# Patient Record
Sex: Female | Born: 1940 | Race: White | Hispanic: No | Marital: Married | Smoking: Current some day smoker
Health system: Southern US, Community
[De-identification: ages and names within clinical notes are randomized; demographics above are authoritative.]

## PROBLEM LIST (undated history)

## (undated) DIAGNOSIS — E785 Hyperlipidemia, unspecified: Secondary | ICD-10-CM

## (undated) DIAGNOSIS — I639 Cerebral infarction, unspecified: Secondary | ICD-10-CM

## (undated) DIAGNOSIS — I10 Essential (primary) hypertension: Secondary | ICD-10-CM

## (undated) DIAGNOSIS — Z95828 Presence of other vascular implants and grafts: Secondary | ICD-10-CM

## (undated) DIAGNOSIS — Z720 Tobacco use: Secondary | ICD-10-CM

## (undated) DIAGNOSIS — I2510 Atherosclerotic heart disease of native coronary artery without angina pectoris: Secondary | ICD-10-CM

## (undated) HISTORY — DX: Cerebral infarction, unspecified: I63.9

## (undated) HISTORY — DX: Presence of other vascular implants and grafts: Z95.828

## (undated) HISTORY — DX: Hyperlipidemia, unspecified: E78.5

## (undated) HISTORY — DX: Tobacco use: Z72.0

## (undated) HISTORY — DX: Essential (primary) hypertension: I10

## (undated) HISTORY — DX: Atherosclerotic heart disease of native coronary artery without angina pectoris: I25.10

---

## 2001-09-12 ENCOUNTER — Other Ambulatory Visit: Admission: RE | Admit: 2001-09-12 | Discharge: 2001-09-12 | Payer: Self-pay | Admitting: Family Medicine

## 2004-06-09 ENCOUNTER — Emergency Department (HOSPITAL_COMMUNITY): Admission: EM | Admit: 2004-06-09 | Discharge: 2004-06-09 | Payer: Self-pay | Admitting: Emergency Medicine

## 2005-01-21 ENCOUNTER — Ambulatory Visit: Payer: Self-pay | Admitting: Internal Medicine

## 2006-02-07 ENCOUNTER — Ambulatory Visit: Payer: Self-pay | Admitting: Internal Medicine

## 2006-02-10 ENCOUNTER — Ambulatory Visit: Payer: Self-pay | Admitting: General Surgery

## 2006-10-18 ENCOUNTER — Ambulatory Visit: Payer: Self-pay | Admitting: Cardiovascular Disease

## 2006-10-18 HISTORY — PX: ABDOMINAL ANGIOGRAM: SHX5705

## 2007-02-09 ENCOUNTER — Ambulatory Visit: Payer: Self-pay | Admitting: Internal Medicine

## 2007-04-18 ENCOUNTER — Ambulatory Visit (HOSPITAL_COMMUNITY): Admission: RE | Admit: 2007-04-18 | Discharge: 2007-04-18 | Payer: Self-pay | Admitting: Cardiovascular Disease

## 2007-04-18 HISTORY — PX: CARDIAC CATHETERIZATION: SHX172

## 2009-04-30 ENCOUNTER — Encounter: Payer: Self-pay | Admitting: Cardiovascular Disease

## 2009-11-14 ENCOUNTER — Telehealth (INDEPENDENT_AMBULATORY_CARE_PROVIDER_SITE_OTHER): Payer: Self-pay | Admitting: *Deleted

## 2010-01-08 ENCOUNTER — Inpatient Hospital Stay (HOSPITAL_COMMUNITY)
Admission: EM | Admit: 2010-01-08 | Discharge: 2010-01-10 | Payer: Self-pay | Source: Home / Self Care | Attending: Internal Medicine | Admitting: Internal Medicine

## 2010-01-08 LAB — CBC
HCT: 42.6 % (ref 36.0–46.0)
Hemoglobin: 13.8 g/dL (ref 12.0–15.0)
MCH: 30.9 pg (ref 26.0–34.0)
MCHC: 32.4 g/dL (ref 30.0–36.0)
MCV: 95.5 fL (ref 78.0–100.0)
Platelets: 192 10*3/uL (ref 150–400)
RBC: 4.46 MIL/uL (ref 3.87–5.11)
RDW: 13.7 % (ref 11.5–15.5)
WBC: 10.8 10*3/uL — ABNORMAL HIGH (ref 4.0–10.5)

## 2010-01-08 LAB — BASIC METABOLIC PANEL
BUN: 10 mg/dL (ref 6–23)
CO2: 25 mEq/L (ref 19–32)
Calcium: 9.8 mg/dL (ref 8.4–10.5)
Chloride: 106 mEq/L (ref 96–112)
Creatinine, Ser: 0.92 mg/dL (ref 0.4–1.2)
GFR calc Af Amer: >60 mL/min (ref >60)
GFR calc non Af Amer: >60 mL/min (ref >60)
Glucose, Bld: 90 mg/dL (ref 70–99)
Potassium: 4.1 mEq/L (ref 3.5–5.1)
Sodium: 140 mEq/L (ref 135–145)

## 2010-01-08 LAB — SEDIMENTATION RATE: Sed Rate: 10 mm/hr (ref 0–22)

## 2010-01-08 LAB — CK TOTAL AND CKMB (NOT AT ARMC)
CK, MB: 1.2 ng/mL (ref 0.3–4.0)
Relative Index: INVALID (ref 0.0–2.5)
Total CK: 55 U/L (ref 7–177)

## 2010-01-08 LAB — GLUCOSE, CAPILLARY: Glucose-Capillary: 84 mg/dL (ref 70–99)

## 2010-01-08 LAB — DIFFERENTIAL
Basophils Absolute: 0 10*3/uL (ref 0.0–0.1)
Basophils Relative: 0 % (ref 0–1)
Eosinophils Absolute: 0.1 10*3/uL (ref 0.0–0.7)
Eosinophils Relative: 1 % (ref 0–5)
Lymphocytes Relative: 16 % (ref 12–46)
Lymphs Abs: 1.8 10*3/uL (ref 0.7–4.0)
Monocytes Absolute: 0.8 10*3/uL (ref 0.1–1.0)
Monocytes Relative: 7 % (ref 3–12)
Neutro Abs: 8.2 10*3/uL — ABNORMAL HIGH (ref 1.7–7.7)
Neutrophils Relative %: 76 % (ref 43–77)

## 2010-01-08 LAB — TROPONIN I: Troponin I: 0.02 ng/mL (ref 0.00–0.06)

## 2010-01-08 LAB — PROTIME-INR
INR: 0.95 (ref 0.00–1.49)
Prothrombin Time: 12.9 seconds (ref 11.6–15.2)

## 2010-01-08 LAB — APTT: aPTT: 31 seconds (ref 24–37)

## 2010-01-09 ENCOUNTER — Encounter (INDEPENDENT_AMBULATORY_CARE_PROVIDER_SITE_OTHER): Payer: Self-pay | Admitting: Internal Medicine

## 2010-01-09 LAB — CBC
HCT: 41.7 % (ref 36.0–46.0)
Hemoglobin: 13.8 g/dL (ref 12.0–15.0)
MCH: 31.5 pg (ref 26.0–34.0)
MCHC: 33.1 g/dL (ref 30.0–36.0)
MCV: 95.2 fL (ref 78.0–100.0)
Platelets: 183 10*3/uL (ref 150–400)
RBC: 4.38 MIL/uL (ref 3.87–5.11)
RDW: 13.7 % (ref 11.5–15.5)
WBC: 7.7 10*3/uL (ref 4.0–10.5)

## 2010-01-09 LAB — COMPREHENSIVE METABOLIC PANEL
ALT: 13 U/L (ref 0–35)
AST: 17 U/L (ref 0–37)
Albumin: 3.4 g/dL — ABNORMAL LOW (ref 3.5–5.2)
Alkaline Phosphatase: 73 U/L (ref 39–117)
BUN: 11 mg/dL (ref 6–23)
CO2: 23 mEq/L (ref 19–32)
Calcium: 9.5 mg/dL (ref 8.4–10.5)
Chloride: 107 mEq/L (ref 96–112)
Creatinine, Ser: 0.95 mg/dL (ref 0.4–1.2)
GFR calc Af Amer: >60 mL/min (ref >60)
GFR calc non Af Amer: 58 mL/min — ABNORMAL LOW (ref >60)
Glucose, Bld: 91 mg/dL (ref 70–99)
Potassium: 3.9 mEq/L (ref 3.5–5.1)
Sodium: 142 mEq/L (ref 135–145)
Total Bilirubin: 0.4 mg/dL (ref 0.3–1.2)
Total Protein: 6.3 g/dL (ref 6.0–8.3)

## 2010-01-09 LAB — URINALYSIS, ROUTINE W REFLEX MICROSCOPIC
Bilirubin Urine: NEGATIVE
Ketones, ur: NEGATIVE mg/dL
Nitrite: NEGATIVE
Protein, ur: NEGATIVE mg/dL
Specific Gravity, Urine: 1.017 (ref 1.005–1.030)
Urine Glucose, Fasting: NEGATIVE mg/dL
Urobilinogen, UA: 0.2 mg/dL (ref 0.0–1.0)
pH: 6 (ref 5.0–8.0)

## 2010-01-09 LAB — LIPID PANEL
Cholesterol: 113 mg/dL (ref 0–200)
HDL: 51 mg/dL (ref >39)
LDL Cholesterol: 43 mg/dL (ref 0–99)
Total CHOL/HDL Ratio: 2.2 RATIO
Triglycerides: 94 mg/dL (ref <150)
VLDL: 19 mg/dL (ref 0–40)

## 2010-01-09 LAB — ANA: Anti Nuclear Antibody(ANA): NEGATIVE

## 2010-01-09 LAB — URINE MICROSCOPIC-ADD ON

## 2010-01-09 LAB — HEMOGLOBIN A1C
Hgb A1c MFr Bld: 6 % — ABNORMAL HIGH (ref <5.7)
Mean Plasma Glucose: 126 mg/dL — ABNORMAL HIGH (ref <117)

## 2010-01-09 LAB — MAGNESIUM: Magnesium: 2.1 mg/dL (ref 1.5–2.5)

## 2010-02-03 NOTE — Miscellaneous (Signed)
Summary: metoprolol refill  Clinical Lists Changes  Medications: Added new medication of METOPROLOL SUCCINATE 25 MG XR24H-TAB (METOPROLOL SUCCINATE) Take one tablet by mouth daily - Signed Rx of METOPROLOL SUCCINATE 25 MG XR24H-TAB (METOPROLOL SUCCINATE) Take one tablet by mouth daily;  #30 x 3;  Signed;  Entered by: Charlena Cross, RN, BSN;  Authorized by: Dossie Arbour MD;  Method used: Electronically to [facility] [address] [place], [place]  [address], Ph: [phone], Fax: [phone]    Prescriptions: METOPROLOL SUCCINATE 25 MG XR24H-TAB (METOPROLOL SUCCINATE) Take one tablet by mouth daily  #30 x 3   Entered by:   Charlena Cross, RN, BSN   Authorized by:   Dossie Arbour MD   Signed by:   Charlena Cross, RN, BSN on 04/30/2009   Method used:   Electronically to        [facility]* (retail)       [address]       [place], [place]  [address]       Ph: [phone]       Fax: [phone]   RxID:   [id number]

## 2010-02-03 NOTE — Progress Notes (Signed)
       New/Updated Medications: CRESTOR 40 MG TABS (ROSUVASTATIN CALCIUM) one tablet at bedtime LISINOPRIL 20 MG TABS (LISINOPRIL) one tablet once daily ISOSORBIDE MONONITRATE CR 60 MG XR24H-TAB (ISOSORBIDE MONONITRATE) one tablet once daily

## 2010-02-24 ENCOUNTER — Ambulatory Visit: Payer: Self-pay | Admitting: Internal Medicine

## 2010-05-19 NOTE — Discharge Summary (Signed)
NAMEWEDA, BAUMGARNER NO.:  [id number]   MEDICAL RECORD NO.:  [id number]          PATIENT TYPE:  OIB   LOCATION:  2899                         FACILITY:  [facility]   PHYSICIAN:  Antonieta Iba, MD   DATE OF BIRTH:  1940-04-10   DATE OF ADMISSION:  04/18/2007  DATE OF DISCHARGE:  04/18/2007                               DISCHARGE SUMMARY   PHYSICIAN PERFORMING PROCEDURE:  Antonieta Iba, MD.   REASON FOR PROCEDURE:  Mrs. Wachtel is a 70 year old woman with a history  of peripheral vascular disease, history of stenting to her iliac artery,  mild-to-moderate carotid disease, hyperlipidemia, smoker with a negative  stress test in 2008, who presents with worsening chest pain and left arm  pain with exertion.  She was brought for the cardiac catheterization,  given her profound history of peripheral vascular disease and symptoms  suggestive of unstable angina.   PROCEDURES:  Ms. Patient was explained the risks and benefits of the  cardiac catheterization.  She has been aware of the stroke risks and  heart attack risks and agreed to have the procedure performed.  She was  prepped and draped in the usual sterile fashion and the right groin was  numbed using local anesthesia.  A 5-French catheter was used to  cannulate the right femoral artery using the modified Seldinger  technique.  A 5-French JL-4 and JR-4 were used to cannulate the left  main and ostial RCA and injections were performed of the coronary  anatomy and recorded at different angles to analyze the coronary artery.  A pigtail catheter was advanced into the LV and LV gram was performed  using a power injection of 30 mL, over 12 mL per second.  Pullback was  also recorded to evaluate any further aortic stenosis.   RESULTS:  1. Left main:  The left main is a short moderate sized vessel with no      significant disease.  They bifurcate into the LAD and left      circumflex.  2. Left anterior descending:  The  LAD is a moderate-length vessel with      one-diagonal vessel.  There is a 30% proximal LAD disease, that is      eccentric, long.  The mid distal LAD has no significant disease and      diagonal vessel appears to have no significant stenosis.  3. Left circumflex.  The left circumflex has several obtuse marginal      branches and is a dominant vessel.  There is 60% to 70% disease of      the mid circumflex after the take off of the OM1.  Otherwise, there      is only mild luminal irregularities noted.  4. Right coronary artery:  The RCA is a nondominant small vessel with      mild luminal irregularities noted in the proximal region of 10 to      20%.  5. LV gram was performed showing normal LV systolic function with      estimated ejection fraction greater than 55%.  There  is no evidence      of aortic stenosis or mitral regurgitation.   FINAL IMPRESSION:  Left dominant coronary system with moderately severe  mid left circumflex disease at the take-off of the obtuse marginal  branch.  There is also  luminal irregularities of the proximal LAD, that  are approximately 30% and mild irregularities of the RCA.  These  findings were discussed  with Mrs. Mal Amabile, and as she  is symptom free on Imdur 30 mg twice a day and Bystolic as well as her  other outpatient medications,  she preferred no intervention at this  time.  She was going to stop smoking, and I will work with her to get  her lipids as low as possible.  Should she continue to have symptoms, we  will bring her back for PTCA of her lesion in the circumflex.      Antonieta Iba, MD  Electronically Signed     TJG/MEDQ  D:  04/18/2007  T:  04/19/2007  Job:  [id number]

## 2011-05-19 ENCOUNTER — Ambulatory Visit: Payer: Self-pay | Admitting: Ophthalmology

## 2011-05-31 ENCOUNTER — Ambulatory Visit: Payer: Self-pay | Admitting: Ophthalmology

## 2012-05-30 ENCOUNTER — Telehealth: Payer: Self-pay | Admitting: Cardiovascular Disease

## 2012-05-30 NOTE — Telephone Encounter (Signed)
Returned call.  Female answering stated pt is at dentist's office.  Stated he will go back there and have pt call.  Pt also needs an appt to be seen as it has been over 1 year since her last visit.

## 2012-05-30 NOTE — Telephone Encounter (Signed)
Message forwarded to B. Hager, PA-C for further instructions.  

## 2012-05-30 NOTE — Telephone Encounter (Signed)
Pt called back and informed.  Stated the dentist, Dr. Morrison Old, needs a fax with the information.  Fax# [phone].  Pt informed form will be faxed and that she will need an appt as it has been >34yr since last visit (scheduling will contact pt).  Pt verbalized understanding and agreed w/ plan.  Form faxed.

## 2012-05-30 NOTE — Telephone Encounter (Signed)
Restart ASA and Plavix after dental appt.

## 2012-05-30 NOTE — Telephone Encounter (Signed)
Pt is at the dentist right now-Too herself off of Plavix about 2 wks ago and stopped aspirin also.The dentist need you to fax that this is all right.Please fax to Dr Morrison Old [phone] Please do this asap!

## 2012-06-05 ENCOUNTER — Ambulatory Visit: Payer: Self-pay | Admitting: Cardiovascular Disease

## 2012-06-09 ENCOUNTER — Ambulatory Visit: Payer: Self-pay | Admitting: Cardiovascular Disease

## 2012-06-20 ENCOUNTER — Encounter: Payer: Self-pay | Admitting: Cardiovascular Disease

## 2012-06-21 ENCOUNTER — Encounter: Payer: Self-pay | Admitting: Cardiovascular Disease

## 2012-06-21 ENCOUNTER — Ambulatory Visit (INDEPENDENT_AMBULATORY_CARE_PROVIDER_SITE_OTHER): Payer: Medicare Other | Admitting: Cardiovascular Disease

## 2012-06-21 VITALS — BP 142/86 | HR 57 | Ht 69.0 in | Wt 154.0 lb

## 2012-06-21 DIAGNOSIS — I2510 Atherosclerotic heart disease of native coronary artery without angina pectoris: Secondary | ICD-10-CM

## 2012-06-21 DIAGNOSIS — I10 Essential (primary) hypertension: Secondary | ICD-10-CM

## 2012-06-21 DIAGNOSIS — I739 Peripheral vascular disease, unspecified: Secondary | ICD-10-CM | POA: Insufficient documentation

## 2012-06-21 DIAGNOSIS — E785 Hyperlipidemia, unspecified: Secondary | ICD-10-CM | POA: Insufficient documentation

## 2012-06-21 NOTE — Patient Instructions (Addendum)
Your physician wants you to follow-up in: 1 year. You will receive a reminder letter in the mail two months in advance. If you don't receive a letter, please call our office to schedule the follow-up appointment.  

## 2012-06-21 NOTE — Assessment & Plan Note (Signed)
Status post PTA and stenting of her left common iliac artery 10/18/06. Followup Dopplers performed 01/15/11 were normal. The patient denies claudication.

## 2012-06-21 NOTE — Assessment & Plan Note (Signed)
Status post cardiac catheterization performed 04/18/07 revealing a left dominant system with a 60-70% AV groove circumflex stenosis after the first marginal branch. LV function was normal. Patient gets occasional exertional chest pain which has not changed in frequency or severity. Her last Myoview stress test performed 12/15/10 was nonischemic.

## 2012-06-21 NOTE — Assessment & Plan Note (Signed)
On statin therapy followed by her primary care physician 

## 2012-06-21 NOTE — Assessment & Plan Note (Signed)
Stable on appropriate medications

## 2012-06-21 NOTE — Progress Notes (Signed)
06/21/2012 Lacey Allison   1940/08/12  [id number]  Primary Physician Lacey Shaggy, MD Primary Cardiologist: Lacey Gess MD Lacey Allison   HPI:  The patient is a 72 year old, mildly overweight, married Caucasian female, mother of 2, grandmother to 2 grandchildren who I last saw in the office approximately 5 weeks ago. She returns today to follow up her abnormal EKG. She has a history of CAD status post cath April 18, 2007, revealing a left dominant system, 60% to 70% mid AV groove circumflex stenosis just after a first marginal branch, a 30% eccentric LAD stenosis, normal LV function. She also has history of PVOD status post left common iliac artery PTA and stenting in the past by Dr. Jacinto Allison. She admits to smoking 1 pack per day, and has treated hypertension, dyslipidemia and family history of heart disease. She has complained of some left arm pain at night and had an episode of jaw pain a year ago but none since. Myoview performed in our office December 15, 2010, was completely normal without ischemia. Her EKG does show new inferolateral T-wave inversion compared to prior EKGs, which is somewhat concerning. Lab work performed October 29, 2010, reveals a total cholesterol 136, LDL 58 and HDL 57. Since I last saw her in the office 02/01/11 she denies changes in exertional chest pain. She denies claudication. She's changed from smoking cigarettes to  e- cigarette.      Current Outpatient Prescriptions  Medication Sig Dispense Refill  . aspirin 325 MG tablet Take 325 mg by mouth daily.      . cetirizine (ZYRTEC) 10 MG tablet Take 10 mg by mouth daily.      . clopidogrel (PLAVIX) 75 MG tablet Take 75 mg by mouth daily.      . isosorbide dinitrate (ISORDIL) 30 MG tablet Take 30 mg by mouth daily.       Marland Kitchen lisinopril (PRINIVIL,ZESTRIL) 20 MG tablet Take 20 mg by mouth daily.      . metoprolol (LOPRESSOR) 50 MG tablet Take 50 mg by mouth 2 (two) times daily.      . nitroGLYCERIN  (NITROSTAT) 0.4 MG SL tablet Place 0.4 mg under the tongue every 5 (five) minutes as needed for chest pain.      . rosuvastatin (CRESTOR) 20 MG tablet Take 20 mg by mouth daily.       No current facility-administered medications for this visit.    Allergies  Allergen Reactions  . Bee Venom     History   Social History  . Marital Status: Married    Spouse Name: N/A    Number of Children: N/A  . Years of Education: N/A   Occupational History  . Not on file.   Social History Main Topics  . Smoking status: Current Some Day Smoker -- 50 years  . Smokeless tobacco: Never Used  . Alcohol Use: Yes     Comment: 1-2 drinks every night  . Drug Use: No  . Sexually Active: Not on file   Other Topics Concern  . Not on file   Social History Narrative  . No narrative on file     Review of Systems: General: negative for chills, fever, night sweats or weight changes.  Cardiovascular: negative for chest pain, dyspnea on exertion, edema, orthopnea, palpitations, paroxysmal nocturnal dyspnea or shortness of breath Dermatological: negative for rash Respiratory: negative for cough or wheezing Urologic: negative for hematuria Abdominal: negative for nausea, vomiting, diarrhea, bright red blood per rectum, melena, or hematemesis  Neurologic: negative for visual changes, syncope, or dizziness All other systems reviewed and are otherwise negative except as noted above.    Blood pressure 142/86, pulse 57, height 5\' 9"  (1.753 m), weight 154 lb (69.854 kg).  General appearance: alert and no distress Neck: no adenopathy, no carotid bruit, no JVD, supple, symmetrical, trachea midline and thyroid not enlarged, symmetric, no tenderness/mass/nodules Lungs: clear to auscultation bilaterally Heart: regular rate and rhythm, S1, S2 normal, no murmur, click, rub or gallop Extremities: extremities normal, atraumatic, no cyanosis or edema  EKG sinus bradycardia at 57 with inferolateral T-wave inversion  similar to prior EKGs  ASSESSMENT AND PLAN:   Dyslipidemia On statin therapy followed by her primary care physician  Essential hypertension Stable on appropriate medications  Peripheral arterial disease Status post PTA and stenting of her left common iliac artery 10/18/06. Followup Dopplers performed 01/15/11 were normal. The patient denies claudication.  Coronary artery disease Status post cardiac catheterization performed 04/18/07 revealing a left dominant system with a 60-70% AV groove circumflex stenosis after the first marginal branch. LV function was normal. Patient gets occasional exertional chest pain which has not changed in frequency or severity. Her last Myoview stress test performed 12/15/10 was nonischemic.      Lacey Gess MD FACP,FACC,FAHA, [facility] 06/21/2012 11:08 AM

## 2012-07-24 ENCOUNTER — Other Ambulatory Visit: Payer: Self-pay | Admitting: *Deleted

## 2012-07-24 MED ORDER — ROSUVASTATIN CALCIUM 20 MG PO TABS: 20.0000 mg | ORAL_TABLET | Freq: Every day | ORAL | Status: DC

## 2012-08-21 ENCOUNTER — Other Ambulatory Visit: Payer: Self-pay | Admitting: *Deleted

## 2012-08-21 MED ORDER — LISINOPRIL 20 MG PO TABS: 20.0000 mg | ORAL_TABLET | Freq: Every day | ORAL | Status: DC

## 2012-08-21 NOTE — Telephone Encounter (Signed)
Rx was sent to pharmacy electronically. 

## 2013-04-18 ENCOUNTER — Other Ambulatory Visit: Payer: Self-pay | Admitting: *Deleted

## 2013-04-18 MED ORDER — ISOSORBIDE DINITRATE 30 MG PO TABS: 30.0000 mg | ORAL_TABLET | Freq: Every day | ORAL | Status: DC

## 2013-04-20 ENCOUNTER — Ambulatory Visit: Payer: Self-pay | Admitting: Internal Medicine

## 2013-05-04 ENCOUNTER — Other Ambulatory Visit: Payer: Self-pay

## 2013-05-04 NOTE — Telephone Encounter (Signed)
Received refill request for Isosorbide Mononitrate. Per last office note with Dr Erlene QuanJ Berry, patient reported Isosorbide Dinitrate. Patient has recently picked up that Dinitrate and per Robbie LisBrittainy Simmons, PA-C, she should stay on Dinitrate.

## 2013-06-29 ENCOUNTER — Other Ambulatory Visit: Payer: Self-pay | Admitting: *Deleted

## 2013-06-29 MED ORDER — ISOSORBIDE DINITRATE 30 MG PO TABS: 30.0000 mg | ORAL_TABLET | Freq: Every day | ORAL | Status: DC

## 2013-07-03 ENCOUNTER — Ambulatory Visit: Payer: Self-pay | Admitting: Ophthalmology

## 2013-07-09 ENCOUNTER — Ambulatory Visit: Payer: Self-pay | Admitting: Ophthalmology

## 2013-07-11 ENCOUNTER — Telehealth: Payer: Self-pay | Admitting: Cardiovascular Disease

## 2013-07-11 DIAGNOSIS — E785 Hyperlipidemia, unspecified: Secondary | ICD-10-CM

## 2013-07-11 MED ORDER — ATORVASTATIN CALCIUM 40 MG PO TABS: 40.0000 mg | ORAL_TABLET | Freq: Every day | ORAL | Status: AC

## 2013-07-11 NOTE — Telephone Encounter (Signed)
Patient cannot afford Crestor 20 mg.  Pharmacy suggesting Lipitor 40 mg .Co pay is significantly less.  Please call

## 2013-07-11 NOTE — Telephone Encounter (Signed)
Spoke with amy, crestor changed to lipitor. Pt will have labs checked in 3 months.

## 2013-07-12 ENCOUNTER — Other Ambulatory Visit: Payer: Self-pay | Admitting: *Deleted

## 2013-07-27 ENCOUNTER — Other Ambulatory Visit: Payer: Self-pay | Admitting: *Deleted

## 2013-07-27 MED ORDER — ISOSORBIDE DINITRATE 30 MG PO TABS: 30.0000 mg | ORAL_TABLET | Freq: Every day | ORAL | Status: DC

## 2013-07-27 NOTE — Telephone Encounter (Signed)
Refilled isosorbide DN 30 mg to [facility]. Notation made appointment needed before any additional refills.

## 2013-08-21 ENCOUNTER — Other Ambulatory Visit: Payer: Self-pay | Admitting: *Deleted

## 2013-08-21 MED ORDER — LISINOPRIL 20 MG PO TABS: 20.0000 mg | ORAL_TABLET | Freq: Every day | ORAL | Status: DC

## 2013-08-21 MED ORDER — ISOSORBIDE DINITRATE 30 MG PO TABS: 30.0000 mg | ORAL_TABLET | Freq: Every day | ORAL | Status: DC

## 2013-09-19 ENCOUNTER — Other Ambulatory Visit: Payer: Self-pay | Admitting: *Deleted

## 2013-09-19 NOTE — Telephone Encounter (Signed)
Patient has an appointment on 09/21/13. Isosorbide and Lisinopril will be filled at patient appointment.

## 2013-09-20 ENCOUNTER — Other Ambulatory Visit: Payer: Self-pay | Admitting: *Deleted

## 2013-09-20 ENCOUNTER — Ambulatory Visit (INDEPENDENT_AMBULATORY_CARE_PROVIDER_SITE_OTHER): Payer: Medicare Other | Admitting: Cardiovascular Disease

## 2013-09-20 ENCOUNTER — Encounter: Payer: Self-pay | Admitting: Cardiovascular Disease

## 2013-09-20 VITALS — BP 116/72 | HR 89 | Ht 70.0 in | Wt 148.0 lb

## 2013-09-20 DIAGNOSIS — I209 Angina pectoris, unspecified: Secondary | ICD-10-CM

## 2013-09-20 DIAGNOSIS — I10 Essential (primary) hypertension: Secondary | ICD-10-CM

## 2013-09-20 DIAGNOSIS — I2510 Atherosclerotic heart disease of native coronary artery without angina pectoris: Secondary | ICD-10-CM

## 2013-09-20 DIAGNOSIS — R0989 Other specified symptoms and signs involving the circulatory and respiratory systems: Secondary | ICD-10-CM

## 2013-09-20 DIAGNOSIS — I739 Peripheral vascular disease, unspecified: Secondary | ICD-10-CM

## 2013-09-20 DIAGNOSIS — I25119 Atherosclerotic heart disease of native coronary artery with unspecified angina pectoris: Secondary | ICD-10-CM

## 2013-09-20 MED ORDER — ISOSORBIDE DINITRATE 30 MG PO TABS: 30.0000 mg | ORAL_TABLET | Freq: Every day | ORAL | Status: AC

## 2013-09-20 MED ORDER — LISINOPRIL 20 MG PO TABS: 20.0000 mg | ORAL_TABLET | Freq: Every day | ORAL | Status: AC

## 2013-09-20 NOTE — Patient Instructions (Signed)
Your physician recommends that you schedule a follow-up appointment in: 1 Year   Your physician has requested that you have a carotid duplex. This test is an ultrasound of the carotid arteries in your neck. It looks at blood flow through these arteries that supply the brain with blood. Allow one hour for this exam. There are no restrictions or special instructions.  Your physician has requested that you have a lower extremity arterial duplex. This test is an ultrasound of the arteries in the legs. It looks at arterial blood flow in the legs. Allow one hour for Lower Arterial scans. There are no restrictions or special instructions

## 2013-09-20 NOTE — Assessment & Plan Note (Signed)
History of left common iliac artery PTA and stent 10/18/06. She denies claudication. Her left Dopplers performed 01/15/11 revealed normal ABIs bilaterally.

## 2013-09-20 NOTE — Assessment & Plan Note (Signed)
On statin therapy followed by her PCP 

## 2013-09-20 NOTE — Progress Notes (Signed)
09/20/2013 Lacey Allison   Dec 26, 1940  [id number]  Primary Physician Lacey Shaggy, MD Primary Cardiologist: Lacey Gess MD Lacey Allison   HPI:  The patient is a 73year-old, mildly overweight, married Caucasian female, mother of 2, grandmother to 2 grandchildren who I last saw in the office approximately 1 year ago. She has a history of CAD status post cath April 18, 2007, revealing a left dominant system, 60% to 70% mid AV groove circumflex stenosis just after a first marginal branch, a 30% eccentric LAD stenosis, normal LV function.her last functional study performed 12/15/10 was nonischemic. She also has history of PVOD status post left common iliac artery PTA and stenting in the past by Dr. Jacinto Halim. She admits to smoking 1 pack per day, and has treated hypertension, dyslipidemia and family history of heart disease..  She denies chest pain shortness of breath or claudication.   Current Outpatient Prescriptions  Medication Sig Dispense Refill  . aspirin 325 MG tablet Take 325 mg by mouth daily.      Marland Kitchen atorvastatin (LIPITOR) 40 MG tablet Take 1 tablet (40 mg total) by mouth daily.  90 tablet  3  . cetirizine (ZYRTEC) 10 MG tablet Take 10 mg by mouth daily.      . clopidogrel (PLAVIX) 75 MG tablet Take 75 mg by mouth daily.      . isosorbide dinitrate (ISORDIL) 30 MG tablet Take 1 tablet (30 mg total) by mouth daily.  30 tablet  0  . lisinopril (PRINIVIL,ZESTRIL) 20 MG tablet Take 1 tablet (20 mg total) by mouth daily.  30 tablet  0  . metoprolol (LOPRESSOR) 50 MG tablet Take 50 mg by mouth daily.       . nitroGLYCERIN (NITROSTAT) 0.4 MG SL tablet Place 0.4 mg under the tongue every 5 (five) minutes as needed for chest pain.       No current facility-administered medications for this visit.    Allergies  Allergen Reactions  . Bee Venom     History   Social History  . Marital Status: Married    Spouse Name: N/A    Number of Children: N/A  . Years of Education: N/A    Occupational History  . Not on file.   Social History Main Topics  . Smoking status: Current Some Day Smoker -- 50 years  . Smokeless tobacco: Never Used  . Alcohol Use: Yes     Comment: 1-2 drinks every night  . Drug Use: No  . Sexual Activity: Not on file   Other Topics Concern  . Not on file   Social History Narrative  . No narrative on file     Review of Systems: General: negative for chills, fever, night sweats or weight changes.  Cardiovascular: negative for chest pain, dyspnea on exertion, edema, orthopnea, palpitations, paroxysmal nocturnal dyspnea or shortness of breath Dermatological: negative for rash Respiratory: negative for cough or wheezing Urologic: negative for hematuria Abdominal: negative for nausea, vomiting, diarrhea, bright red blood per rectum, melena, or hematemesis Neurologic: negative for visual changes, syncope, or dizziness All other systems reviewed and are otherwise negative except as noted above.    Blood pressure 116/72, pulse 89, height  (1.778 m), weight 148 lb (67.132 kg).  General appearance: alert and no distress Neck: no adenopathy, no JVD, supple, symmetrical, trachea midline, thyroid not enlarged, symmetric, no tenderness/mass/nodules and soft bilateral carotid bruits Lungs: clear to auscultation bilaterally Heart: regular rate and rhythm, S1, S2 normal, no murmur,  click, rub or gallop Extremities: extremities normal, atraumatic, no cyanosis or edema and 2+ femoral arteries without bruits, 2+ pedal pulses  EKG normal sinus rhythm at 89 with nonspecific ST and T wave changes  ASSESSMENT AND PLAN:   Dyslipidemia On statin therapy followed by her PCP  Coronary artery disease History of CAD status post cardiac catheterization 04/18/07 revealing a left dominant system, 60-70% mid AV groove circumflex stenosis just after the first marginal branch, 30% eccentric LAD stenosis with normal LV function. Her last functional study  performed 12/15/07 was nonischemic. She denies chest pain or shortness of breath.  Peripheral arterial disease History of left common iliac artery PTA and stent 10/18/06. She denies claudication. Her left Dopplers performed 01/15/11 revealed normal ABIs bilaterally.  Essential hypertension Controlled on current medications      Lacey Gess MD [facility] 09/20/2013 4:16 PM

## 2013-09-20 NOTE — Assessment & Plan Note (Signed)
Controlled on current medications 

## 2013-09-20 NOTE — Assessment & Plan Note (Signed)
History of CAD status post cardiac catheterization 04/18/07 revealing a left dominant system, 60-70% mid AV groove circumflex stenosis just after the first marginal branch, 30% eccentric LAD stenosis with normal LV function. Her last functional study performed 12/15/07 was nonischemic. She denies chest pain or shortness of breath.

## 2013-10-02 ENCOUNTER — Ambulatory Visit (HOSPITAL_COMMUNITY)
Admission: RE | Admit: 2013-10-02 | Discharge: 2013-10-02 | Disposition: A | Discharge status: Home / Self Care | Payer: Medicare Other | Source: Ambulatory Visit | Attending: Cardiovascular Disease | Admitting: Cardiovascular Disease

## 2013-10-02 DIAGNOSIS — I739 Peripheral vascular disease, unspecified: Secondary | ICD-10-CM | POA: Diagnosis not present

## 2013-10-02 DIAGNOSIS — R0989 Other specified symptoms and signs involving the circulatory and respiratory systems: Secondary | ICD-10-CM

## 2013-10-02 DIAGNOSIS — I714 Abdominal aortic aneurysm, without rupture, unspecified: Secondary | ICD-10-CM | POA: Diagnosis not present

## 2013-10-02 NOTE — Progress Notes (Signed)
Carotid Duplex Completed. °Brianna L Mazza,RVT °

## 2013-10-02 NOTE — Progress Notes (Signed)
Lower Extremity Arterial Duplex Completed. °Brianna L Mazza,RVT °

## 2013-10-09 ENCOUNTER — Telehealth: Payer: Self-pay | Admitting: Cardiovascular Disease

## 2013-10-09 DIAGNOSIS — I739 Peripheral vascular disease, unspecified: Secondary | ICD-10-CM

## 2013-10-09 NOTE — Telephone Encounter (Signed)
Pt was told to call and speak to Lacey Allison about receiving her results from her doppler.Please call back  Thanks

## 2013-10-09 NOTE — Telephone Encounter (Signed)
Spoke to patient. Result given . Verbalized understanding PATIENT WANTED TO KNOW THE MEASUREMENT  3.2 x3.5 cm She aware will repeat in 6 month- ORDER PLACED

## 2014-03-28 ENCOUNTER — Encounter: Payer: Self-pay | Admitting: Cardiovascular Disease

## 2014-04-01 ENCOUNTER — Encounter: Payer: Self-pay | Admitting: *Deleted

## 2014-04-27 NOTE — Op Note (Signed)
PATIENT NAME:  Lacey Allison, Lacey Allison MR#:  [id number] DATE OF BIRTH:  04-18-1940  DATE OF PROCEDURE:  07/09/2013  PREOPERATIVE DIAGNOSIS: Cataract, left eye.   POSTOPERATIVE DIAGNOSIS: Cataract, left eye.  PROCEDURE PERFORMED: Extracapsular cataract extraction using phacoemulsification with placement of Alcon [id number], 21.0 diopter posterior chamber lens, serial number [id number].   SURGEON: Maylon PeppersSteven A. Moody Robben, MD   ANESTHESIA: 4% lidocaine and 0.75% Marcaine in a 50-50 mixture with 10 units/mL of Hylenex added, given as a peribulbar.   ANESTHESIOLOGIST: Dr. Maisie Fushomas.   COMPLICATIONS: None.   ESTIMATED BLOOD LOSS: Less than 1 mL.   DESCRIPTION OF PROCEDURE:  The patient was brought to the operating room and given a peribulbar block.  The patient was then prepped and draped in the usual fashion.  The vertical rectus muscles were imbricated using 5-0 silk sutures.  These sutures were then clamped to the sterile drapes as bridle sutures.  A limbal peritomy was performed extending two clock hours and hemostasis was obtained with cautery.  A partial thickness scleral groove was made at the surgical limbus and dissected anteriorly in a lamellar dissection using an Alcon crescent knife.  The anterior chamber was entered supero-temporally with a Superblade and through the lamellar dissection with a 2.6 mm keratome.  DisCoVisc was used to replace the aqueous and a continuous tear capsulorrhexis was carried out.  Hydrodissection and hydrodelineation were carried out with balanced salt and a 27 gauge canula.  The nucleus was rotated to confirm the effectiveness of the hydrodissection.  Phacoemulsification was carried out using a divide-and-conquer technique.  Total ultrasound time was 59 seconds with an average power of 24.8%, a CDE of 25.01. No suture was placed.  Irrigation/aspiration was used to remove the residual cortex.  DisCoVisc was used to inflate the capsule and the internal incision was enlarged to  3 mm with the crescent knife.  The intraocular lens was folded and inserted into the capsular bag using the AcrySert delivery system.  Irrigation/aspiration was used to remove the residual DisCoVisc.  Miostat was injected into the anterior chamber through the paracentesis track to inflate the anterior chamber and induce miosis.  Two drops of 0.01 mL of diluted Vigamox containing 0.1 mg of drug was injected into the anterior chamber via the paracentesis tract. The wound was checked for leaks and none were found. The conjunctiva was closed with cautery and the bridle sutures were removed.   An eye shield was placed on the eye.  The patient was discharged to the recovery room in good condition.     ____________________________ Maylon PeppersSteven A. Winston Misner, MD sad:lm D: 07/09/2013 13:51:04 ET T: 07/10/2013 01:37:58 ET JOB#: [id number]  cc: Viviann SpareSteven A. Chinedu Agustin, MD, <Dictator> Erline LevineSTEVEN A Sahej Hauswirth MD ELECTRONICALLY SIGNED 07/16/2013 12:52

## 2014-04-28 NOTE — Op Note (Signed)
PATIENT NAME:  Myrlene BrokerBROCK, Ellorie MR#:  [id number] DATE OF BIRTH:  1940/11/27  DATE OF PROCEDURE:  05/31/2011  PREOPERATIVE DIAGNOSIS:  Cataract, right eye.   POSTOPERATIVE DIAGNOSIS:  Cataract, right eye.  PROCEDURE PERFORMED:  Extracapsular cataract extraction using phacoemulsification with placement of an Alcon [id number], 21.5-diopter posterior chamber lens, serial # [id number].  SURGEON:  Maylon PeppersSteven A. Rose Hegner, MD  ASSISTANT:  None.  ANESTHESIA:  4% lidocaine and 0.75% Marcaine in a 50/50 mixture with 10 units per mL of Hylenex added, given as a peribulbar.  ANESTHESIOLOGIST:  Dr. Pernell DupreAdams   COMPLICATIONS:  None.  ESTIMATED BLOOD LOSS:  Less than 1 mL.  DESCRIPTION OF PROCEDURE:  The patient was brought to the operating room and given a peribulbar block.  The patient was then prepped and draped in the usual fashion.  The vertical rectus muscles were imbricated using 5-0 silk sutures.  These sutures were then clamped to the sterile drapes as bridle sutures.  A limbal peritomy was performed extending two clock hours and hemostasis was obtained with cautery.  A partial thickness scleral groove was made at the surgical limbus and dissected anteriorly in a lamellar dissection using an Alcon crescent knife.  The anterior chamber was entered superonasally with a Superblade and through the lamellar dissection with a 2.6 mm keratome.  DisCoVisc was used to replace the aqueous and a continuous tear capsulorrhexis was carried out.  Hydrodissection and hydrodelineation were carried out with balanced salt and a 27 gauge canula.  The nucleus was rotated to confirm the effectiveness of the hydrodissection.  Phacoemulsification was carried out using a divide-and-conquer technique.  Total ultrasound time was 1 minute and 23 seconds with an average power of 20.5 percent, CDE 29.46.  Irrigation/aspiration was used to remove the residual cortex.  DisCoVisc was used to inflate the capsule and the internal incision was  enlarged to 3 mm with the crescent knife.  The intraocular lens was folded and inserted into the capsular bag using the AcrySert delivery system.  Irrigation/aspiration was used to remove the residual DisCoVisc.  Miostat was injected into the anterior chamber through the paracentesis track to inflate the anterior chamber and induce miosis.  The wound was checked for leaks and none were found. The conjunctiva was closed with cautery and the bridle sutures were removed.  Two drops of 0.3% Vigamox were placed on the eye.   An eye shield was placed on the eye.  The patient was discharged to the recovery room in good condition.  ____________________________ Maylon PeppersSteven A. Isadore Palecek, MD sad:drc D: 05/31/2011 13:08:33 ET T: 05/31/2011 14:05:51 ET JOB#: [id number]  cc: Viviann SpareSteven A. Maanav Kassabian, MD, <Dictator> Erline LevineSTEVEN A Judith Campillo MD ELECTRONICALLY SIGNED 06/07/2011 12:53

## 2014-07-02 ENCOUNTER — Telehealth: Payer: Self-pay | Admitting: Gastroenterology

## 2014-07-02 NOTE — Telephone Encounter (Signed)
Pt on blood thinners, She has a consult appt with kandice jones then colon will be scheduled

## 2014-07-02 NOTE — Telephone Encounter (Signed)
Referral for colonoscopy triage

## 2014-07-02 NOTE — Telephone Encounter (Signed)
Phoned pt for colon triage, Pt needs office appt on blood thinners

## 2014-07-05 ENCOUNTER — Ambulatory Visit: Payer: Self-pay | Admitting: Urgent Care

## 2014-08-07 ENCOUNTER — Inpatient Hospital Stay (HOSPITAL_COMMUNITY): Payer: Medicare Other

## 2014-08-07 ENCOUNTER — Emergency Department (HOSPITAL_COMMUNITY): Payer: Medicare Other

## 2014-08-07 ENCOUNTER — Inpatient Hospital Stay (HOSPITAL_COMMUNITY)
Admission: EM | Admit: 2014-08-07 | Discharge: 2014-09-05 | DRG: 064 | Disposition: E | Payer: Medicare Other | Attending: Neurology | Admitting: Neurology

## 2014-08-07 ENCOUNTER — Encounter (HOSPITAL_COMMUNITY): Payer: Self-pay | Admitting: Radiology

## 2014-08-07 DIAGNOSIS — R000 Tachycardia, unspecified: Secondary | ICD-10-CM | POA: Diagnosis present

## 2014-08-07 DIAGNOSIS — Z79899 Other long term (current) drug therapy: Secondary | ICD-10-CM | POA: Diagnosis not present

## 2014-08-07 DIAGNOSIS — G935 Compression of brain: Secondary | ICD-10-CM | POA: Diagnosis present

## 2014-08-07 DIAGNOSIS — I10 Essential (primary) hypertension: Secondary | ICD-10-CM | POA: Diagnosis not present

## 2014-08-07 DIAGNOSIS — Z7902 Long term (current) use of antithrombotics/antiplatelets: Secondary | ICD-10-CM | POA: Diagnosis not present

## 2014-08-07 DIAGNOSIS — R739 Hyperglycemia, unspecified: Secondary | ICD-10-CM | POA: Diagnosis present

## 2014-08-07 DIAGNOSIS — Z66 Do not resuscitate: Secondary | ICD-10-CM | POA: Diagnosis present

## 2014-08-07 DIAGNOSIS — E785 Hyperlipidemia, unspecified: Secondary | ICD-10-CM | POA: Diagnosis present

## 2014-08-07 DIAGNOSIS — Z91030 Bee allergy status: Secondary | ICD-10-CM | POA: Diagnosis not present

## 2014-08-07 DIAGNOSIS — J9600 Acute respiratory failure, unspecified whether with hypoxia or hypercapnia: Secondary | ICD-10-CM | POA: Diagnosis not present

## 2014-08-07 DIAGNOSIS — I2510 Atherosclerotic heart disease of native coronary artery without angina pectoris: Secondary | ICD-10-CM | POA: Diagnosis present

## 2014-08-07 DIAGNOSIS — I611 Nontraumatic intracerebral hemorrhage in hemisphere, cortical: Principal | ICD-10-CM | POA: Diagnosis present

## 2014-08-07 DIAGNOSIS — I6200 Nontraumatic subdural hemorrhage, unspecified: Secondary | ICD-10-CM | POA: Diagnosis present

## 2014-08-07 DIAGNOSIS — Z515 Encounter for palliative care: Secondary | ICD-10-CM

## 2014-08-07 DIAGNOSIS — Z8673 Personal history of transient ischemic attack (TIA), and cerebral infarction without residual deficits: Secondary | ICD-10-CM

## 2014-08-07 DIAGNOSIS — I629 Nontraumatic intracranial hemorrhage, unspecified: Secondary | ICD-10-CM

## 2014-08-07 DIAGNOSIS — F17210 Nicotine dependence, cigarettes, uncomplicated: Secondary | ICD-10-CM | POA: Diagnosis present

## 2014-08-07 DIAGNOSIS — I619 Nontraumatic intracerebral hemorrhage, unspecified: Secondary | ICD-10-CM | POA: Diagnosis present

## 2014-08-07 DIAGNOSIS — G9340 Encephalopathy, unspecified: Secondary | ICD-10-CM | POA: Diagnosis present

## 2014-08-07 DIAGNOSIS — Z7982 Long term (current) use of aspirin: Secondary | ICD-10-CM

## 2014-08-07 DIAGNOSIS — W1830XA Fall on same level, unspecified, initial encounter: Secondary | ICD-10-CM | POA: Diagnosis present

## 2014-08-07 DIAGNOSIS — I615 Nontraumatic intracerebral hemorrhage, intraventricular: Secondary | ICD-10-CM | POA: Diagnosis present

## 2014-08-07 DIAGNOSIS — E876 Hypokalemia: Secondary | ICD-10-CM

## 2014-08-07 DIAGNOSIS — Y92009 Unspecified place in unspecified non-institutional (private) residence as the place of occurrence of the external cause: Secondary | ICD-10-CM | POA: Diagnosis not present

## 2014-08-07 DIAGNOSIS — Z791 Long term (current) use of non-steroidal anti-inflammatories (NSAID): Secondary | ICD-10-CM | POA: Diagnosis not present

## 2014-08-07 DIAGNOSIS — G936 Cerebral edema: Secondary | ICD-10-CM | POA: Diagnosis present

## 2014-08-07 DIAGNOSIS — Z95828 Presence of other vascular implants and grafts: Secondary | ICD-10-CM

## 2014-08-07 LAB — BLOOD GAS, ARTERIAL
Acid-base deficit: 3.3 mmol/L — ABNORMAL HIGH (ref 0.0–2.0)
Acid-base deficit: 4.9 mmol/L — ABNORMAL HIGH (ref 0.0–2.0)
BICARBONATE: 21 meq/L (ref 20.0–24.0)
BICARBONATE: 19 meq/L — AB (ref 20.0–24.0)
DRAWN BY: 44138
Drawn by: 41875
FIO2: 1
FIO2: 0.4
LHR: 16 {breaths}/min
MECHVT: 560 mL
MECHVT: 560 mL
O2 SAT: 99.2 %
O2 Saturation: 99.5 %
PATIENT TEMPERATURE: 98.6
PEEP/CPAP: 5 cmH2O
PEEP: 5 cmH2O
PH ART: 7.375 (ref 7.350–7.450)
PO2 ART: 161 mmHg — AB (ref 80.0–100.0)
Patient temperature: 98.6
RATE: 16 resp/min
TCO2: 22.1 mmol/L (ref 0–100)
TCO2: 20 mmol/L (ref 0–100)
pCO2 arterial: 36.8 mmHg (ref 35.0–45.0)
pCO2 arterial: 31.3 mmHg — ABNORMAL LOW (ref 35.0–45.0)
pH, Arterial: 7.401 (ref 7.350–7.450)
pO2, Arterial: 439 mmHg — ABNORMAL HIGH (ref 80.0–100.0)

## 2014-08-07 LAB — I-STAT CHEM 8, ED
BUN: 17 mg/dL (ref 6–20)
CHLORIDE: 104 mmol/L (ref 101–111)
CREATININE: 1 mg/dL (ref 0.44–1.00)
Calcium, Ion: 1.27 mmol/L (ref 1.13–1.30)
Glucose, Bld: 206 mg/dL — ABNORMAL HIGH (ref 65–99)
HCT: 48 % — ABNORMAL HIGH (ref 36.0–46.0)
Hemoglobin: 16.3 g/dL — ABNORMAL HIGH (ref 12.0–15.0)
POTASSIUM: 3.2 mmol/L — AB (ref 3.5–5.1)
Sodium: 141 mmol/L (ref 135–145)
TCO2: 21 mmol/L (ref 0–100)

## 2014-08-07 LAB — DIFFERENTIAL
BASOS ABS: 0 10*3/uL (ref 0.0–0.1)
Basophils Relative: 0 % (ref 0–1)
Eosinophils Absolute: 0.2 10*3/uL (ref 0.0–0.7)
Eosinophils Relative: 1 % (ref 0–5)
LYMPHS PCT: 24 % (ref 12–46)
Lymphs Abs: 5.5 10*3/uL — ABNORMAL HIGH (ref 0.7–4.0)
MONOS PCT: 6 % (ref 3–12)
Monocytes Absolute: 1.3 10*3/uL — ABNORMAL HIGH (ref 0.1–1.0)
Neutro Abs: 16.4 10*3/uL — ABNORMAL HIGH (ref 1.7–7.7)
Neutrophils Relative %: 69 % (ref 43–77)

## 2014-08-07 LAB — COMPREHENSIVE METABOLIC PANEL
ALBUMIN: 3.9 g/dL (ref 3.5–5.0)
ALT: 26 U/L (ref 14–54)
AST: 38 U/L (ref 15–41)
Alkaline Phosphatase: 83 U/L (ref 38–126)
Anion gap: 13 (ref 5–15)
BUN: 15 mg/dL (ref 6–20)
CO2: 20 mmol/L — ABNORMAL LOW (ref 22–32)
CREATININE: 1.1 mg/dL — AB (ref 0.44–1.00)
Calcium: 9.9 mg/dL (ref 8.9–10.3)
Chloride: 107 mmol/L (ref 101–111)
GFR calc Af Amer: 56 mL/min — ABNORMAL LOW (ref >60)
GFR, EST NON AFRICAN AMERICAN: 49 mL/min — AB (ref >60)
GLUCOSE: 201 mg/dL — AB (ref 65–99)
Potassium: 3.4 mmol/L — ABNORMAL LOW (ref 3.5–5.1)
SODIUM: 140 mmol/L (ref 135–145)
TOTAL PROTEIN: 6.6 g/dL (ref 6.5–8.1)
Total Bilirubin: 0.4 mg/dL (ref 0.3–1.2)

## 2014-08-07 LAB — BASIC METABOLIC PANEL
ANION GAP: 15 (ref 5–15)
BUN: 14 mg/dL (ref 6–20)
CALCIUM: 9.6 mg/dL (ref 8.9–10.3)
CO2: 21 mmol/L — AB (ref 22–32)
Chloride: 105 mmol/L (ref 101–111)
Creatinine, Ser: 1.19 mg/dL — ABNORMAL HIGH (ref 0.44–1.00)
GFR, EST AFRICAN AMERICAN: 51 mL/min — AB (ref >60)
GFR, EST NON AFRICAN AMERICAN: 44 mL/min — AB (ref >60)
Glucose, Bld: 224 mg/dL — ABNORMAL HIGH (ref 65–99)
Potassium: 2.7 mmol/L — CL (ref 3.5–5.1)
Sodium: 141 mmol/L (ref 135–145)

## 2014-08-07 LAB — GLUCOSE, CAPILLARY
Glucose-Capillary: 214 mg/dL — ABNORMAL HIGH (ref 65–99)
Glucose-Capillary: 164 mg/dL — ABNORMAL HIGH (ref 65–99)

## 2014-08-07 LAB — CBC
HCT: 45.2 % (ref 36.0–46.0)
HCT: 41.2 % (ref 36.0–46.0)
HEMOGLOBIN: 14 g/dL (ref 12.0–15.0)
HEMOGLOBIN: 15.1 g/dL — AB (ref 12.0–15.0)
MCH: 32.3 pg (ref 26.0–34.0)
MCH: 32.1 pg (ref 26.0–34.0)
MCHC: 33.4 g/dL (ref 30.0–36.0)
MCHC: 34 g/dL (ref 30.0–36.0)
MCV: 94.5 fL (ref 78.0–100.0)
MCV: 96.8 fL (ref 78.0–100.0)
PLATELETS: 183 10*3/uL (ref 150–400)
Platelets: 179 10*3/uL (ref 150–400)
RBC: 4.67 MIL/uL (ref 3.87–5.11)
RBC: 4.36 MIL/uL (ref 3.87–5.11)
RDW: 13.1 % (ref 11.5–15.5)
RDW: 13 % (ref 11.5–15.5)
WBC: 23.4 10*3/uL — ABNORMAL HIGH (ref 4.0–10.5)
WBC: 27.2 10*3/uL — ABNORMAL HIGH (ref 4.0–10.5)

## 2014-08-07 LAB — URINE MICROSCOPIC-ADD ON

## 2014-08-07 LAB — RAPID URINE DRUG SCREEN, HOSP PERFORMED
AMPHETAMINES: NOT DETECTED
Barbiturates: NOT DETECTED
Benzodiazepines: NOT DETECTED
Cocaine: NOT DETECTED
Opiates: NOT DETECTED
Tetrahydrocannabinol: NOT DETECTED

## 2014-08-07 LAB — MRSA PCR SCREENING: MRSA BY PCR: NEGATIVE

## 2014-08-07 LAB — I-STAT TROPONIN, ED: TROPONIN I, POC: 0 ng/mL (ref 0.00–0.08)

## 2014-08-07 LAB — CBG MONITORING, ED: Glucose-Capillary: 214 mg/dL — ABNORMAL HIGH (ref 65–99)

## 2014-08-07 LAB — URINALYSIS, ROUTINE W REFLEX MICROSCOPIC
Bilirubin Urine: NEGATIVE
Glucose, UA: 500 mg/dL — AB
KETONES UR: NEGATIVE mg/dL
Leukocytes, UA: NEGATIVE
Nitrite: NEGATIVE
PH: 7 (ref 5.0–8.0)
SPECIFIC GRAVITY, URINE: 1.013 (ref 1.005–1.030)
UROBILINOGEN UA: 0.2 mg/dL (ref 0.0–1.0)

## 2014-08-07 LAB — APTT: aPTT: 23 seconds — ABNORMAL LOW (ref 24–37)

## 2014-08-07 LAB — PROTIME-INR
INR: 0.97 (ref 0.00–1.49)
Prothrombin Time: 13.1 seconds (ref 11.6–15.2)

## 2014-08-07 LAB — ETHANOL: Alcohol, Ethyl (B): <5 mg/dL (ref <5)

## 2014-08-07 MED ORDER — LORAZEPAM 2 MG/ML IJ SOLN: 2.0000 mg | INTRAMUSCULAR | Status: DC | PRN

## 2014-08-07 MED ORDER — SENNOSIDES-DOCUSATE SODIUM 8.6-50 MG PO TABS
1.0000 | ORAL_TABLET | Freq: Two times a day (BID) | ORAL | Status: DC
  Filled 2014-08-07 (×2): qty 1

## 2014-08-07 MED ORDER — SODIUM CHLORIDE 0.9 % IV SOLN
100.0000 ug/h | INTRAVENOUS | Status: DC
  Administered 2014-08-07: 100 ug/h via INTRAVENOUS
  Filled 2014-08-07: qty 50

## 2014-08-07 MED ORDER — INSULIN ASPART 100 UNIT/ML ~~LOC~~ SOLN
2.0000 [IU] | SUBCUTANEOUS | Status: DC
  Administered 2014-08-07: 6 [IU] via SUBCUTANEOUS
  Administered 2014-08-07: 4 [IU] via SUBCUTANEOUS

## 2014-08-07 MED ORDER — SCOPOLAMINE 1 MG/3DAYS TD PT72
1.0000 | MEDICATED_PATCH | TRANSDERMAL | Status: DC
  Administered 2014-08-07: 1.5 mg via TRANSDERMAL
  Filled 2014-08-07: qty 1

## 2014-08-07 MED ORDER — CHLORHEXIDINE GLUCONATE 0.12 % MT SOLN
15.0000 mL | Freq: Two times a day (BID) | OROMUCOSAL | Status: DC
  Filled 2014-08-07: qty 15

## 2014-08-07 MED ORDER — CLEVIDIPINE BUTYRATE 0.5 MG/ML IV EMUL
0.0000 mg/h | INTRAVENOUS | Status: DC
  Administered 2014-08-07: 2 mg/h via INTRAVENOUS
  Filled 2014-08-07: qty 50

## 2014-08-07 MED ORDER — SODIUM CHLORIDE 0.9 % IV SOLN
1.0000 mg/h | INTRAVENOUS | Status: DC
  Administered 2014-08-07: 2 mg/h via INTRAVENOUS
  Filled 2014-08-07: qty 10

## 2014-08-07 MED ORDER — FENTANYL CITRATE (PF) 100 MCG/2ML IJ SOLN
INTRAMUSCULAR | Status: AC
  Administered 2014-08-07: 25 ug
  Filled 2014-08-07: qty 2

## 2014-08-07 MED ORDER — SODIUM CHLORIDE 3 % IV SOLN
INTRAVENOUS | Status: DC
  Administered 2014-08-07: 75 mL/h via INTRAVENOUS
  Filled 2014-08-07 (×3): qty 500

## 2014-08-07 MED ORDER — CHLORHEXIDINE GLUCONATE 0.12 % MT SOLN
15.0000 mL | Freq: Two times a day (BID) | OROMUCOSAL | Status: DC
  Administered 2014-08-07: 15 mL via OROMUCOSAL

## 2014-08-07 MED ORDER — CETYLPYRIDINIUM CHLORIDE 0.05 % MT LIQD
7.0000 mL | Freq: Four times a day (QID) | OROMUCOSAL | Status: DC
  Administered 2014-08-07: 7 mL via OROMUCOSAL

## 2014-08-07 MED ORDER — ACETAMINOPHEN 325 MG PO TABS: 650.0000 mg | ORAL_TABLET | ORAL | Status: DC | PRN

## 2014-08-07 MED ORDER — FAMOTIDINE IN NACL 20-0.9 MG/50ML-% IV SOLN
20.0000 mg | Freq: Two times a day (BID) | INTRAVENOUS | Status: DC
  Administered 2014-08-07: 20 mg via INTRAVENOUS
  Filled 2014-08-07 (×3): qty 50

## 2014-08-07 MED ORDER — SODIUM CHLORIDE 23.4 % INJECTION (4 MEQ/ML) FOR IV ADMINISTRATION
30.0000 mL | Freq: Once | INTRAVENOUS | Status: AC
  Administered 2014-08-07: 30 mL via INTRAVENOUS
  Filled 2014-08-07: qty 30

## 2014-08-07 MED ORDER — ACETAMINOPHEN 650 MG RE SUPP: 650.0000 mg | RECTAL | Status: DC | PRN

## 2014-08-07 MED ORDER — MORPHINE SULFATE 2 MG/ML IJ SOLN: 2.0000 mg | INTRAMUSCULAR | Status: DC | PRN

## 2014-08-07 MED ORDER — POTASSIUM CHLORIDE 10 MEQ/50ML IV SOLN
10.0000 meq | INTRAVENOUS | Status: DC
  Administered 2014-08-07 (×4): 10 meq via INTRAVENOUS
  Filled 2014-08-07 (×4): qty 50

## 2014-08-07 MED ORDER — PANTOPRAZOLE SODIUM 40 MG IV SOLR
40.0000 mg | Freq: Every day | INTRAVENOUS | Status: DC
  Filled 2014-08-07: qty 40

## 2014-08-07 MED ORDER — FENTANYL BOLUS VIA INFUSION
50.0000 ug | INTRAVENOUS | Status: DC | PRN
  Filled 2014-08-07: qty 200

## 2014-08-07 MED ORDER — SODIUM CHLORIDE 0.9 % IV SOLN
Freq: Once | INTRAVENOUS | Status: AC
  Administered 2014-08-07: 02:00:00 via INTRAVENOUS

## 2014-08-07 MED ORDER — CLEVIDIPINE BUTYRATE 0.5 MG/ML IV EMUL
1.0000 mg/h | INTRAVENOUS | Status: DC
Start: 2014-08-07 — End: 2014-08-07
  Administered 2014-08-07 (×2): 6 mg/h via INTRAVENOUS
  Filled 2014-08-07: qty 50
  Filled 2014-08-07: qty 100
  Filled 2014-08-07: qty 50

## 2014-08-07 MED ORDER — STROKE: EARLY STAGES OF RECOVERY BOOK
Freq: Once | Status: AC
  Administered 2014-08-07: 1
  Filled 2014-08-07: qty 1

## 2014-08-10 NOTE — Discharge Summary (Signed)
Stroke Discharge Summary  Patient ID: Lacey Allison   MRN: [id number]      DOB: 08/01/1940  Date of Admission: 08-20-2014 Date of Discharge: 08/10/2014  Attending Physician:  No att. providers found, Stroke MD  Consulting Physician(s):     pulmonary/intensive care  Patient's PCP:  Jaclyn Shaggy, MD  DISCHARGE DIAGNOSIS:  Active Problems:   ICH (intracerebral hemorrhage)   Cerebral herniation   ICH with ventricular extension   Malignant HTN   BMI: Body mass index is 21.19 kg/(m^2).  Past Medical History  Diagnosis Date  . S/P insertion of iliac artery stent     LEA DOPPLER, 01/15/2011 - ABDOMINAL AORTA-aneurysmal dilation with maximum diameter of 2.8cm  . CAD (coronary artery disease)     LEXISCAN, 12/15/2010 - EKG negatvie for ischemia, post-stress EF 76%, normal scan  . CVA (cerebral vascular accident)     2D ECHO, 01/09/2010 - EF 60-65%, normal  . Hypertension   . Dyslipidemia   . Tobacco abuse    Past Surgical History  Procedure Laterality Date  . Abdominal angiogram  10/18/2006    Ostium of the common iliac artery stented with a 9x64mm Omnilink stent with excellent results  . Cardiac catheterization  04/18/2007    Medical and lifestyle managment      Medication List    ASK your doctor about these medications        atorvastatin 40 MG tablet  Commonly known as:  LIPITOR  Take 1 tablet (40 mg total) by mouth daily.     cetirizine 10 MG tablet  Commonly known as:  ZYRTEC  Take 10 mg by mouth daily.     clopidogrel 75 MG tablet  Commonly known as:  PLAVIX  Take 75 mg by mouth daily.     isosorbide dinitrate 30 MG tablet  Commonly known as:  ISORDIL  Take 1 tablet (30 mg total) by mouth daily.     lisinopril 20 MG tablet  Commonly known as:  PRINIVIL,ZESTRIL  Take 1 tablet (20 mg total) by mouth daily.     metoprolol 50 MG tablet  Commonly known as:  LOPRESSOR  Take 75 mg by mouth 2 (two) times daily.     nitroGLYCERIN 0.4 MG SL tablet  Commonly known  as:  NITROSTAT  Place 0.4 mg under the tongue every 5 (five) minutes as needed for chest pain.     varenicline 0.5 MG tablet  Commonly known as:  CHANTIX  Take 0.5 mg by mouth 2 (two) times daily.     zolpidem 5 MG tablet  Commonly known as:  AMBIEN  Take 5 mg by mouth at bedtime as needed for sleep.        LABORATORY STUDIES CBC    Component Value Date/Time   WBC 27.2* 2014-08-20 0436   RBC 4.36 08/20/14 0436   HGB 14.0 08-20-2014 0436   HCT 41.2 20-Aug-2014 0436   PLT 179 08/20/2014 0436   MCV 94.5 August 20, 2014 0436   MCH 32.1 08-20-2014 0436   MCHC 34.0 08-20-14 0436   RDW 13.0 August 20, 2014 0436   LYMPHSABS 5.5* 20-Aug-2014 0053   MONOABS 1.3* August 20, 2014 0053   EOSABS 0.2 08-20-2014 0053   BASOSABS 0.0 2014-08-20 0053   CMP    Component Value Date/Time   NA 141 08-20-14 0436   K 2.7* August 20, 2014 0436   CL 105 August 20, 2014 0436   CO2 21* August 20, 2014 0436   GLUCOSE 224* 2014/08/20 0436   BUN 14 2014/08/20  0436   CREATININE 1.19* 08-09-14 0436   CALCIUM 9.6 2014-08-09 0436   PROT 6.6 2014-08-09 0053   ALBUMIN 3.9 09-Aug-2014 0053   AST 38 2014/08/09 0053   ALT 26 09-Aug-2014 0053   ALKPHOS 83 2014/08/09 0053   BILITOT 0.4 08/09/14 0053   GFRNONAA 44* 08-09-2014 0436   GFRAA 51* 09-Aug-2014 0436   COAGS Lab Results  Component Value Date   INR 0.97 08/09/2014   INR 0.95 01/08/2010   Lipid Panel    Component Value Date/Time   CHOL  01/09/2010 0620    113        ATP III CLASSIFICATION:  <200     mg/dL   Desirable  161-096  mg/dL   Borderline High  >=045    mg/dL   High          TRIG 94 01/09/2010 0620   HDL 51 01/09/2010 0620   CHOLHDL 2.2 01/09/2010 0620   VLDL 19 01/09/2010 0620   LDLCALC  01/09/2010 0620    43        Total Cholesterol/HDL:CHD Risk Coronary Heart Disease Risk Table                     Men   Women  1/2 Average Risk   3.4   3.3  Average Risk       5.0   4.4  2 X Average Risk   9.6   7.1  3 X Average Risk  23.4   11.0         Use the calculated Patient Ratio above and the CHD Risk Table to determine the patient's CHD Risk.        ATP III CLASSIFICATION (LDL):  <100     mg/dL   Optimal  409-811  mg/dL   Near or Above                    Optimal  130-159  mg/dL   Borderline  914-782  mg/dL   High  >956     mg/dL   Very High   [id number]  Lab Results  Component Value Date   HGBA1C * 01/08/2010    6.0 (NOTE)                                                                       According to the ADA Clinical Practice Recommendations for 2011, when HbA1c is used as a screening test:   >=6.5%   Diagnostic of Diabetes Mellitus           (if abnormal result  is confirmed)  5.7-6.4%   Increased risk of developing Diabetes Mellitus  References:Diagnosis and Classification of Diabetes Mellitus,Diabetes Care,2011,34(Suppl 1):S62-S69 and Standards of Medical Care in         Diabetes - 2011,Diabetes Care,2011,34  (Suppl 1):S11-S61.   Cardiac Panel (last 3 results) No results for input(s): CKTOTAL, CKMB, TROPONINI, RELINDX in the last 72 hours. Urinalysis    Component Value Date/Time   COLORURINE YELLOW 2014-08-09 0329   APPEARANCEUR CLEAR Aug 09, 2014 0329   LABSPEC 1.013 2014/08/09 0329   PHURINE 7.0 08-09-14 0329   GLUCOSEU 500* 09-Aug-2014 0329   HGBUR MODERATE* Aug 09, 2014 0329   BILIRUBINUR NEGATIVE  08-28-14 0329   KETONESUR NEGATIVE Aug 28, 2014 0329   PROTEINUR >300* Aug 28, 2014 0329   UROBILINOGEN 0.2 August 28, 2014 0329   NITRITE NEGATIVE 08-28-14 0329   LEUKOCYTESUR NEGATIVE 28-Aug-2014 0329   Urine Drug Screen     Component Value Date/Time   LABOPIA NONE DETECTED 2014-08-28 0329   COCAINSCRNUR NONE DETECTED August 28, 2014 0329   LABBENZ NONE DETECTED 08/28/2014 0329   AMPHETMU NONE DETECTED 2014/08/28 0329   THCU NONE DETECTED 2014-08-28 0329   LABBARB NONE DETECTED 08/28/14 0329    Alcohol Level    Component Value Date/Time   ETH <5 2014/08/28 0053     SIGNIFICANT DIAGNOSTIC STUDIES Ct Head Wo  Contrast  August 28, 2014   CLINICAL DATA:  Found unresponsive  EXAM: CT HEAD WITHOUT CONTRAST  TECHNIQUE: Contiguous axial images were obtained from the base of the skull through the vertex without intravenous contrast.  COMPARISON:  None.  FINDINGS: There is a large intraparenchymal left frontal hemorrhage measuring approximately 7 cm in diameter with estimated volume of 170 mL. There is 2 cm left-to-right midline shift. There is intraventricular blood in the lateral ventricles, third ventricle and fourth ventricle. There is effacement of the basal cisterns. There is a small subdural hemorrhage in the anterior temporal regions bilaterally and probably also at the undersurface of the frontal lobes anteriorly.  IMPRESSION: Large intraparenchymal left frontal hemorrhage with 2 cm left-to-right midline shift. There is effacement of the basal cisterns concerning for herniation. There is intraventricular blood. There is a small volume subdural hemorrhage. Critical Value/emergent results were called by telephone at the time of interpretation on 28-Aug-2014 at 1:14 am to Dr. Gwyneth Sprout , who verbally acknowledged these results.   Electronically Signed   By: Ellery Plunk M.D.   On: August 28, 2014 01:18   Dg Chest Portable 1 View  2014/08/28   CLINICAL DATA:  Central line placement.  Initial encounter.  EXAM: PORTABLE CHEST - 1 VIEW  COMPARISON:  Chest radiograph performed 09/22/2012  FINDINGS: The patient's left IJ line is noted ending about the mid to distal SVC. The endotracheal tube is seen ending 3-4 cm above the carina. An enteric tube is noted extending below the diaphragm.  The lungs are well-aerated and clear. There is no evidence of focal opacification, pleural effusion or pneumothorax.  The cardiomediastinal silhouette is borderline normal in size. No acute osseous abnormalities are seen.  IMPRESSION: 1. Left IJ line noted ending about the mid to distal SVC. 2. Endotracheal tube seen ending 3-4 cm above the  carina. 3. No acute cardiopulmonary process seen.   Electronically Signed   By: Roanna Raider M.D.   On: 08-28-2014 02:54      HISTORY OF PRESENT ILLNESS Lacey Allison is an 74 y.o. female who was home with her husband. She was at baseline. Husband heard her fall. When he got to her she was unresponsive. EMS was called at that time and the patient was brought in as a code stroke. Patient had episodes of vomiting. For airway protection patient was intubated by EMS. Initial NIHSS of 33. Patient has not regained consciousness.   Date last known well: Date: 08/06/2014 Time last known well: Time: 23:45 tPA Given: No: ICH  ICH Score: 4   HOSPITAL COURSE Ms. Lacey Allison is a 74 y.o. female with history of HTN, HLD, smoker, CAD, PVD, stroke in January 2012 admitted for an responsiveness, found to have left large frontal ICH with IVH and significant midline shift. Intubated for airway protection, discussed with family  about poor prognosis and they request comfort care measures,  Left large frontal ICH with midline shift, likely due to uncontrolled hypertension.  Intubated, not on sedation, tachycardia  CT showed left large frontal ICH, about 60 mL, ICH score 4  SCDs for VTE prophylaxis  Plavix prior to admission, now on no antithrombotic  Discuss with family at bedside regarding poor prognosis with high mortality rates. Family request comfort care and withdrawal care as per her wishes.  Cerebral edema and impending herniation  Significant midline shift  On 3% saline  ICH score 4 with mortality rate 97% within 1 month  Discuss with family and as per patient wishes, family request withdraw care  Hypertension, malignant  Home meds: Lisinopril, metoprolol  Currently on IV cleviprex  Unstable  Will d/c cleviprex due to comfort care  Hyperlipidemia  Home meds: Lipitor 40  Other Stroke Risk Factors  Advanced age  Cigarette smoker, advised to stop smoking  Hx  stroke/TIA  Coronary artery disease  Other Active Problems  Hypokalemia  Significant leukocytosis  Other Pertinent History  Comfort care  Palliative care consult requested  DISCHARGE EXAM Pt deceased.  Marvel Plan, MD PhD Stroke Neurology 08/10/2014 1:04 AM

## 2014-09-05 NOTE — ED Notes (Signed)
Pt. Was at home walking around and just fell to the ground. Not a traumatic fall. Per EMS husband let wife lie on the ground for 20-30 min before calling EMS. On EMS arrival pt was still on the ground unresponsive. Pt. Was intubated in the field. Pt. Is posturing on arrival. Pt haas hx of TIAs.

## 2014-09-05 NOTE — Consult Note (Signed)
PULMONARY / CRITICAL CARE MEDICINE   Name: Lacey Allison MRN: [id number] DOB: 02/26/1940    ADMISSION DATE:  08-28-14 CONSULTATION DATE:  2014/08/28  REFERRING MD :  Dr. Thad Ranger  CHIEF COMPLAINT:  Altered mental status  INITIAL PRESENTATION:  74 yo female smoker fell at home, and was unresponsive.  Intubated for airway protection.  Found to have massive ICH.  STUDIES:  8/03 CT head >> large Lt frontal ICH with 2 cm Lt to Rt shift, IVH, small b/l SDH  SIGNIFICANT EVENTS: 8/03 Admit   HISTORY OF PRESENT ILLNESS:  Pt is encephalopathic; therefore, history obtained from chart review. 74 yo female fell at home and was reportedly down for 20-30 mins prior to EMS arrival.  On arrival by EMS she was unresponsive.  Intubated for airway protection.  Noted to have BP up to 226/90 in ER.  CT head showed massive ICH.  Neurology assessed in ER for admission.  Started on clevaprex for HTN. PCCM consulted to assist with vent management and CVL placement.   PAST MEDICAL HISTORY :   has a past medical history of S/P insertion of iliac artery stent; CAD (coronary artery disease); CVA (cerebral vascular accident); Hypertension; Dyslipidemia; and Tobacco abuse.    has past surgical history that includes Abdominal angiogram (10/18/2006) and Cardiac catheterization (04/18/2007).   Prior to Admission medications   Medication Sig Start Date End Date Taking? Authorizing Provider  atorvastatin (LIPITOR) 40 MG tablet Take 1 tablet (40 mg total) by mouth daily. 07/11/13  Yes Runell Gess, MD  clopidogrel (PLAVIX) 75 MG tablet Take 75 mg by mouth daily.   Yes Historical Provider, MD  isosorbide dinitrate (ISORDIL) 30 MG tablet Take 1 tablet (30 mg total) by mouth daily. 09/20/13  Yes Runell Gess, MD  lisinopril (PRINIVIL,ZESTRIL) 20 MG tablet Take 1 tablet (20 mg total) by mouth daily. 09/20/13  Yes Runell Gess, MD  metoprolol (LOPRESSOR) 50 MG tablet Take 75 mg by mouth 2 (two) times daily.    Yes  Historical Provider, MD  varenicline (CHANTIX) 0.5 MG tablet Take 0.5 mg by mouth 2 (two) times daily.   Yes Historical Provider, MD  zolpidem (AMBIEN) 5 MG tablet Take 5 mg by mouth at bedtime as needed for sleep.   Yes Historical Provider, MD  aspirin 325 MG tablet Take 325 mg by mouth daily.    Historical Provider, MD  cetirizine (ZYRTEC) 10 MG tablet Take 10 mg by mouth daily.    Historical Provider, MD  nitroGLYCERIN (NITROSTAT) 0.4 MG SL tablet Place 0.4 mg under the tongue every 5 (five) minutes as needed for chest pain.    Historical Provider, MD   Allergies  Allergen Reactions  . Bee Venom     FAMILY HISTORY:  indicated that her mother is alive. She indicated that her father is deceased. She indicated that her brother is alive. She indicated that her maternal grandmother is deceased. She indicated that her maternal grandfather is deceased. She indicated that her paternal grandmother is deceased. She indicated that her paternal grandfather is deceased. She indicated that both of her childs are alive.    SOCIAL HISTORY:  reports that she has been smoking.  She has never used smokeless tobacco. She reports that she drinks alcohol. She reports that she does not use illicit drugs.  REVIEW OF SYSTEMS:   Unable to obtain  SUBJECTIVE:   VITAL SIGNS: Pulse Rate:  [63-107] 91 (08/03 0134) Resp:  [16-27] 16 (08/03 0134) BP: (151-226)/(58-125)  202/78 mmHg (08/03 0134) SpO2:  [99 %-100 %] 100 % (08/03 0134) FiO2 (%):  [50 %-100 %] 50 % (08/03 0125) Weight:  [67.132 kg (148 lb)] 67.132 kg (148 lb) (08/03 0110) HEMODYNAMICS:   VENTILATOR SETTINGS: Vent Mode:  [-] PRVC FiO2 (%):  [50 %-100 %] 50 % Set Rate:  [16 bmp] 16 bmp Vt Set:  [560 mL] 560 mL PEEP:  [5 cmH20] 5 cmH20 Plateau Pressure:  [13 cmH20] 13 cmH20 INTAKE / OUTPUT: No intake or output data in the 24 hours ending August 15, 2014 0157  PHYSICAL EXAMINATION: General:  Elderly female on vent Neuro:  Unresponsive on vent,  posturing with any stimulation.  HEENT:  [place]/AT, no pupil response, 6mm bilaterally.  Cardiovascular:  RRR, no MRG Lungs:  Clear bilateral breath sounds Abdomen:  Soft, non-tender, non-distended Musculoskeletal:  No acute deformity Skin:  Grossly intact  LABS:  CBC  Recent Labs Lab Aug 15, 2014 0053 08/15/14 0100  WBC 23.4*  --   HGB 15.1* 16.3*  HCT 45.2 48.0*  PLT 183  --    Coag's  Recent Labs Lab 2014-08-15 0053  APTT 23*  INR 0.97   BMET  Recent Labs Lab 08-15-2014 0053 08-15-2014 0100  NA 140 141  K 3.4* 3.2*  CL 107 104  CO2 20*  --   BUN 15 17  CREATININE 1.10* 1.00  GLUCOSE 201* 206*   Electrolytes  Recent Labs Lab August 15, 2014 0053  CALCIUM 9.9   ABG  Recent Labs Lab 2014-08-15 0115  PHART 7.375  PCO2ART 36.8  PO2ART 439*   Liver Enzymes  Recent Labs Lab August 15, 2014 0053  AST 38  ALT 26  ALKPHOS 83  BILITOT 0.4  ALBUMIN 3.9   Cardiac Enzymes No results for input(s): TROPONINI, PROBNP in the last 168 hours.   Glucose  Recent Labs Lab Aug 15, 2014 0137  GLUCAP 214*    Imaging Ct Head Wo Contrast  08/15/2014   CLINICAL DATA:  Found unresponsive  EXAM: CT HEAD WITHOUT CONTRAST  TECHNIQUE: Contiguous axial images were obtained from the base of the skull through the vertex without intravenous contrast.  COMPARISON:  None.  FINDINGS: There is a large intraparenchymal left frontal hemorrhage measuring approximately 7 cm in diameter with estimated volume of 170 mL. There is 2 cm left-to-right midline shift. There is intraventricular blood in the lateral ventricles, third ventricle and fourth ventricle. There is effacement of the basal cisterns. There is a small subdural hemorrhage in the anterior temporal regions bilaterally and probably also at the undersurface of the frontal lobes anteriorly.  IMPRESSION: Large intraparenchymal left frontal hemorrhage with 2 cm left-to-right midline shift. There is effacement of the basal cisterns concerning for  herniation. There is intraventricular blood. There is a small volume subdural hemorrhage. Critical Value/emergent results were called by telephone at the time of interpretation on Aug 15, 2014 at 1:14 am to Dr. Gwyneth Sprout , who verbally acknowledged these results.   Electronically Signed   By: Ellery Plunk M.D.   On: 08-15-14 01:18     ASSESSMENT / PLAN:  PULMONARY ETT 8/03 > A: Compromised airway in setting of ICH. Tobacco abuse. P:   Full vent support F/u CXR ABG  CARDIOVASCULAR 8/3 [facility] CVL >>> A:  HTN emergency. Hx of HTN, CAD, PAD, HLD. P:  SBP goals per neurology Celvaprex to maintain SBP goal CXR for CVL placement  RENAL A:   Hypokalemia.  P:   F/u and replace electrolytes as needed  GASTROINTESTINAL A:   Nutrition. P:  NPO Protonix for SUP  HEMATOLOGIC A:   Leukocytosis. P:  F/u CBC SCDs for DVT prevention  INFECTIOUS A:   No evidence for infection. P:   Monitor clinically  ENDOCRINE A:   Hyperglycemia. P:   SSI  NEUROLOGIC A:   Acute encephalopathy 2nd to ICH, IVH, SDH. Hx of CVA. P:   RASS goal: 0 Per neurology CVL placed for hypertonic saline.    FAMILY  - Updates: Husband updated by neurology in ED 8/3  - Inter-disciplinary family meet or Palliative Care meeting due by:  8/10  Joneen Roach, AGACNP-BC [facility] Pulmonology/Critical Care Pager [phone] or [phone]  08/12/14 2:57 AM

## 2014-09-05 NOTE — H&P (Addendum)
Admission H&P    Chief Complaint: Unresponsive  HPI: Lacey Allison is an 74 y.o. female who was home with her husband.  She was at baseline.  Husband heard her fall.  When he got to her she was unresponsive.  EMS was called at that time and the patient was brought in as a code stroke.  Patient had episodes of vomiting.  For airway protection patient was intubated by EMS. Initial NIHSS of 33.  Patient has not regained consciousness.    Date last known well: Date: 08/06/2014 Time last known well: Time: 23:45 tPA Given: No: ICH  ICH Score: 4        Past Medical History  Diagnosis Date  . S/P insertion of iliac artery stent     LEA DOPPLER, 01/15/2011 - ABDOMINAL AORTA-aneurysmal dilation with maximum diameter of 2.8cm  . CAD (coronary artery disease)     LEXISCAN, 12/15/2010 - EKG negatvie for ischemia, post-stress EF 76%, normal scan  . CVA (cerebral vascular accident)     2D ECHO, 01/09/2010 - EF 60-65%, normal  . Hypertension   . Dyslipidemia   . Tobacco abuse     Past Surgical History  Procedure Laterality Date  . Abdominal angiogram  10/18/2006    Ostium of the common iliac artery stented with a 9x5mm Omnilink stent with excellent results  . Cardiac catheterization  04/18/2007    Medical and lifestyle managment    Family History  Problem Relation Age of Onset  . Arrhythmia Mother     Tachycardia, since brith; has Pacemaker  . Heart attack Father   . Diabetes Father   . Obesity Brother   . Diabetes Brother   . Heart attack Maternal Grandfather   . Heart attack Paternal Grandmother   . Cancer Paternal Grandfather   . Cancer Child     Skin  . Hyperlipidemia Child    Social History:  reports that she has been smoking.  She has never used smokeless tobacco. She reports that she drinks alcohol. She reports that she does not use illicit drugs.  Allergies:  Allergies  Allergen Reactions  . Bee Venom    Medications: Prior to Admission medications   Medication Sig Start  Date End Date Taking? Authorizing Provider  atorvastatin (LIPITOR) 40 MG tablet Take 1 tablet (40 mg total) by mouth daily. 07/11/13  Yes Runell Gess, MD  clopidogrel (PLAVIX) 75 MG tablet Take 75 mg by mouth daily.   Yes Historical Provider, MD  isosorbide dinitrate (ISORDIL) 30 MG tablet Take 1 tablet (30 mg total) by mouth daily. 09/20/13  Yes Runell Gess, MD  lisinopril (PRINIVIL,ZESTRIL) 20 MG tablet Take 1 tablet (20 mg total) by mouth daily. 09/20/13  Yes Runell Gess, MD  metoprolol (LOPRESSOR) 50 MG tablet Take 75 mg by mouth 2 (two) times daily.    Yes Historical Provider, MD  varenicline (CHANTIX) 0.5 MG tablet Take 0.5 mg by mouth 2 (two) times daily.   Yes Historical Provider, MD  zolpidem (AMBIEN) 5 MG tablet Take 5 mg by mouth at bedtime as needed for sleep.   Yes Historical Provider, MD  aspirin 325 MG tablet Take 325 mg by mouth daily.    Historical Provider, MD  cetirizine (ZYRTEC) 10 MG tablet Take 10 mg by mouth daily.    Historical Provider, MD  nitroGLYCERIN (NITROSTAT) 0.4 MG SL tablet Place 0.4 mg under the tongue every 5 (five) minutes as needed for chest pain.    Historical Provider,  MD    ROS: Unable to obtain  Physical Examination: Blood pressure 226/90, pulse 80, resp. rate 16, height 5\' 5"  (1.651 m), weight 67.132 kg (148 lb), SpO2 100 %.  General Examination:  HEENT-  Normocephalic, no lesions, without obvious abnormality.  Normal external eye and conjunctiva.  Normal TM's bilaterally.  Normal auditory canals and external ears. Normal external nose, mucus membranes and septum.  Normal pharynx. Cardiovascular- S1, S2 normal, pulses palpable throughout   Lungs- chest clear, no wheezing, rales, normal symmetric air entry Abdomen- soft, non-tender; bowel sounds normal; no masses,  no organomegaly Extremities- no edema Lymph-no adenopathy palpable Musculoskeletal-no joint tenderness, deformity or swelling Skin-warm and dry, no hyperpigmentation,  vitiligo, or suspicious lesions  Neurological Examination Mental Status: Patient does not respond to verbal stimuli.  Decerebrate posturing with deep sternal rub.  Does not follow commands.  No verbalizations noted.  Cranial Nerves: II: patient does not respond confrontation bilaterally, pupils right 3 mm, left 5 mm,and unreactive bilaterally III,IV,VI: doll's response present with the right eye.  Unable to cross midline to the right with the left eye. V,VII: corneal reflex present on the left, weak on the right  VIII: patient does not respond to verbal stimuli IX,X: gag reflex reduced, XI: trapezius strength unable to test bilaterally XII: tongue strength unable to test Motor: Extremities flaccid throughout.  Decerebrate posturing. Sensory: Does not respond to noxious stimuli in the upper extremities, withdrawal with lower extremity stimulation. Deep Tendon Reflexes:  2+ with absent AJ's bilaterally Plantars: upgoing bilaterally Cerebellar: Unable to perform   Laboratory Studies:   Basic Metabolic Panel:  Recent Labs Lab 08/18/2014 0053 2014-08-18 0100  NA 140 141  K 3.4* 3.2*  CL 107 104  CO2 20*  --   GLUCOSE 201* 206*  BUN 15 17  CREATININE 1.10* 1.00  CALCIUM 9.9  --     Liver Function Tests:  Recent Labs Lab 18-Aug-2014 0053  AST 38  ALT 26  ALKPHOS 83  BILITOT 0.4  PROT 6.6  ALBUMIN 3.9   No results for input(s): LIPASE, AMYLASE in the last 168 hours. No results for input(s): AMMONIA in the last 168 hours.  CBC:  Recent Labs Lab 18-Aug-2014 0053 08/18/14 0100  WBC 23.4*  --   NEUTROABS 16.4*  --   HGB 15.1* 16.3*  HCT 45.2 48.0*  MCV 96.8  --   PLT 183  --     Cardiac Enzymes: No results for input(s): CKTOTAL, CKMB, CKMBINDEX, TROPONINI in the last 168 hours.  BNP: Invalid input(s): POCBNP  CBG: No results for input(s): GLUCAP in the last 168 hours.  Microbiology: No results found for this or any previous visit.  Coagulation  Studies:  Recent Labs  August 18, 2014 0053  LABPROT 13.1  INR 0.97    Urinalysis: No results for input(s): COLORURINE, LABSPEC, PHURINE, GLUCOSEU, HGBUR, BILIRUBINUR, KETONESUR, PROTEINUR, UROBILINOGEN, NITRITE, LEUKOCYTESUR in the last 168 hours.  Invalid input(s): APPERANCEUR  Lipid Panel:     Component Value Date/Time   CHOL  01/09/2010 0620    113        ATP III CLASSIFICATION:  <200     mg/dL   Desirable  409-811  mg/dL   Borderline High  >=914    mg/dL   High          TRIG 94 01/09/2010 0620   HDL 51 01/09/2010 0620   CHOLHDL 2.2 01/09/2010 0620   VLDL 19 01/09/2010 0620   LDLCALC  01/09/2010 7829  43        Total Cholesterol/HDL:CHD Risk Coronary Heart Disease Risk Table                     Men   Women  1/2 Average Risk   3.4   3.3  Average Risk       5.0   4.4  2 X Average Risk   9.6   7.1  3 X Average Risk  23.4   11.0        Use the calculated Patient Ratio above and the CHD Risk Table to determine the patient's CHD Risk.        ATP III CLASSIFICATION (LDL):  <100     mg/dL   Optimal  409-811  mg/dL   Near or Above                    Optimal  130-159  mg/dL   Borderline  914-782  mg/dL   High  >956     mg/dL   Very High    [id number]:  Lab Results  Component Value Date   HGBA1C * 01/08/2010    6.0 (NOTE)                                                                       According to the ADA Clinical Practice Recommendations for 2011, when HbA1c is used as a screening test:   >=6.5%   Diagnostic of Diabetes Mellitus           (if abnormal result  is confirmed)  5.7-6.4%   Increased risk of developing Diabetes Mellitus  References:Diagnosis and Classification of Diabetes Mellitus,Diabetes Care,2011,34(Suppl 1):S62-S69 and Standards of Medical Care in         Diabetes - 2011,Diabetes Care,2011,34  (Suppl 1):S11-S61.    Urine Drug Screen:  No results found for: LABOPIA, COCAINSCRNUR, LABBENZ, AMPHETMU, THCU, LABBARB  Alcohol Level:   Recent Labs Lab  08/10/2014 0053  ETH <5    Other results: EKG: sinus tachycardia at 143 bpm.  Imaging: Ct Head Wo Contrast  Aug 10, 2014   CLINICAL DATA:  Found unresponsive  EXAM: CT HEAD WITHOUT CONTRAST  TECHNIQUE: Contiguous axial images were obtained from the base of the skull through the vertex without intravenous contrast.  COMPARISON:  None.  FINDINGS: There is a large intraparenchymal left frontal hemorrhage measuring approximately 7 cm in diameter with estimated volume of 170 mL. There is 2 cm left-to-right midline shift. There is intraventricular blood in the lateral ventricles, third ventricle and fourth ventricle. There is effacement of the basal cisterns. There is a small subdural hemorrhage in the anterior temporal regions bilaterally and probably also at the undersurface of the frontal lobes anteriorly.  IMPRESSION: Large intraparenchymal left frontal hemorrhage with 2 cm left-to-right midline shift. There is effacement of the basal cisterns concerning for herniation. There is intraventricular blood. There is a small volume subdural hemorrhage. Critical Value/emergent results were called by telephone at the time of interpretation on 10-Aug-2014 at 1:14 am to Dr. Gwyneth Sprout , who verbally acknowledged these results.   Electronically Signed   By: Ellery Plunk M.D.   On: 10-Aug-2014 01:18    Assessment: 74 y.o. female presenting after  being found down by her husband.  Presents with decerebrate posturing, unequal and unreactive pupils.  Head CT independently reviewed and shows a large left frontal intracerebral hemorrhage with intraventricular extension.  Left to right midline shift noted as well as a small amount of subdural blood.  Case discussed with Dr. Gerlene Fee.  No surgical intervention indicated.  Prognosis is poor.  Prognosis discussed with husband.  Patient to be a DNR.  Husband to contact family.    Stroke Risk Factors - hyperlipidemia, hypertension and smoking  Plan: 1. Decision to be made  in the morning concerning how long ventilatory support to be continued.  Patient made DNR.   2. PT consult, OT consult, Speech consult 3. BP control with Cleviprex 4. Prophylactic therapy-None 5. NPO  6. Telemetry monitoring 7. Frequent neuro checks 8. CCM consult for management of ventilator and central line placement 9. Hypertonic saline  This patient is critically ill and at significant risk of neurological worsening, death and care requires constant monitoring of vital signs, hemodynamics,respiratory and cardiac monitoring, neurological assessment, discussion with family, other specialists and medical decision making of high complexity. I spent 70 minutes of neurocritical care time  in the care of this patient.  Thana Farr, MD [facility] [phone] 14-Aug-2014  1:57 AM

## 2014-09-05 NOTE — Progress Notes (Signed)
CRITICAL VALUE ALERT  Critical value received:  K 2.7  Date of notification:  August 18, 2014  Time of notification: 0502  Critical value read back:Yes.    Nurse who received alert:  Alphia Moh  MD notified (1st page):  [phone]  Time of first page:  0513  MD notified (2nd page):  Time of second page:  Responding MD:  Pola Corn  Time MD responded:  [phone]

## 2014-09-05 NOTE — Progress Notes (Signed)
[facility]  I saw and examined Lacey Allison today.  Family has been sitting at her bedside and they are requesting that we withdraw care immediately.   On my exam she has some flexion response to pain in the R foot, no eye opening, ETT in place, lungs clear with vent supported breaths.  Chart reviewed, discussed with Dr. Roda Shutters.  She has a dismal prognosis.  Will write comfort care orderset, cancel all orders, have respiratory withdraw care after starting a fentanyl infusion.  Heber North Sultan, MD [facility] Pager: [phone] Cell: [phone] After 3pm or if no response, call [phone]

## 2014-09-05 NOTE — Progress Notes (Signed)
Pt. Noted to be asystole on the monitor. No heart beat verified by two RN's, Thomes Cake and Londell Moh. Family at bedside. Emotional support given. CDS notified of cardiac time of death as 1715.

## 2014-09-05 NOTE — Progress Notes (Signed)
SLP Cancellation Note  Patient Details Name: CARNETTA LOSADA MRN: [id number] DOB: 10-19-1940   Cancelled treatment:       Reason Eval/Treat Not Completed: Medical issues which prohibited therapy;Patient not medically ready. Pt is intubated with poor prognosis. Will sign off. Please reorder if needed.  Harlon Ditty, [place] CCC-SLP [phone]    Claudine Mouton 09/04/2014, 7:50 AM

## 2014-09-05 NOTE — Progress Notes (Signed)
Notified Dr. Hosie Poisson (attending physician) of time of death.

## 2014-09-05 NOTE — Progress Notes (Signed)
STROKE TEAM PROGRESS NOTE   SUBJECTIVE (INTERVAL HISTORY) Her husband and step daughter are at the bedside.  Overall she feels her condition is gradually worsening. Discussed with family regarding pt current diagnosis, treatment, and prognosis. Patient suffered a large left frontal hemorrhage with significant midline shift, ICH score 4, which means 97% a mortality within a month.  OBJECTIVE Temp:  [97.2 F (36.2 C)-98.8 F (37.1 C)] 98.8 F (37.1 C) (08/03 0745) Pulse Rate:  [63-156] 146 (08/03 1015) Cardiac Rhythm:  [-] Sinus tachycardia (08/03 0800) Resp:  [16-31] 20 (08/03 1015) BP: (107-226)/(37-161) 146/67 mmHg (08/03 1015) SpO2:  [97 %-100 %] 100 % (08/03 1015) FiO2 (%):  [30 %-100 %] 30 % (08/03 0800) Weight:  [139 lb 5.3 oz (63.2 kg)-148 lb (67.132 kg)] 139 lb 5.3 oz (63.2 kg) (08/03 0420)   Recent Labs Lab September 01, 2014 0137 09/01/2014 0433 September 01, 2014 0751  GLUCAP 214* 214* 164*    Recent Labs Lab Sep 01, 2014 0053 01-Sep-2014 0100 09/01/14 0436  NA 140 141 141  K 3.4* 3.2* 2.7*  CL 107 104 105  CO2 20*  --  21*  GLUCOSE 201* 206* 224*  BUN 15 17 14   CREATININE 1.10* 1.00 1.19*  CALCIUM 9.9  --  9.6    Recent Labs Lab 09-01-14 0053  AST 38  ALT 26  ALKPHOS 83  BILITOT 0.4  PROT 6.6  ALBUMIN 3.9    Recent Labs Lab 2014/09/01 0053 2014/09/01 0100 09/01/14 0436  WBC 23.4*  --  27.2*  NEUTROABS 16.4*  --   --   HGB 15.1* 16.3* 14.0  HCT 45.2 48.0* 41.2  MCV 96.8  --  94.5  PLT 183  --  179   No results for input(s): CKTOTAL, CKMB, CKMBINDEX, TROPONINI in the last 168 hours.  Recent Labs  01-Sep-2014 0053  LABPROT 13.1  INR 0.97    Recent Labs  09-01-14 0329  COLORURINE YELLOW  LABSPEC 1.013  PHURINE 7.0  GLUCOSEU 500*  HGBUR MODERATE*  BILIRUBINUR NEGATIVE  KETONESUR NEGATIVE  PROTEINUR >300*  UROBILINOGEN 0.2  NITRITE NEGATIVE  LEUKOCYTESUR NEGATIVE       Component Value Date/Time   CHOL  01/09/2010 0620    113        ATP III  CLASSIFICATION:  <200     mg/dL   Desirable  161-096  mg/dL   Borderline High  >=045    mg/dL   High          TRIG 94 01/09/2010 0620   HDL 51 01/09/2010 0620   CHOLHDL 2.2 01/09/2010 0620   VLDL 19 01/09/2010 0620   LDLCALC  01/09/2010 0620    43        Total Cholesterol/HDL:CHD Risk Coronary Heart Disease Risk Table                     Men   Women  1/2 Average Risk   3.4   3.3  Average Risk       5.0   4.4  2 X Average Risk   9.6   7.1  3 X Average Risk  23.4   11.0        Use the calculated Patient Ratio above and the CHD Risk Table to determine the patient's CHD Risk.        ATP III CLASSIFICATION (LDL):  <100     mg/dL   Optimal  409-811  mg/dL   Near or Above  Optimal  130-159  mg/dL   Borderline  161-096  mg/dL   High  >045     mg/dL   Very High   Lab Results  Component Value Date   HGBA1C * 01/08/2010    6.0 (NOTE)                                                                       According to the ADA Clinical Practice Recommendations for 2011, when HbA1c is used as a screening test:   >=6.5%   Diagnostic of Diabetes Mellitus           (if abnormal result  is confirmed)  5.7-6.4%   Increased risk of developing Diabetes Mellitus  References:Diagnosis and Classification of Diabetes Mellitus,Diabetes Care,2011,34(Suppl 1):S62-S69 and Standards of Medical Care in         Diabetes - 2011,Diabetes Care,2011,34  (Suppl 1):S11-S61.      Component Value Date/Time   LABOPIA NONE DETECTED September 03, 2014 0329   COCAINSCRNUR NONE DETECTED September 03, 2014 0329   LABBENZ NONE DETECTED 2014/09/03 0329   AMPHETMU NONE DETECTED 03-Sep-2014 0329   THCU NONE DETECTED 09-03-14 0329   LABBARB NONE DETECTED Sep 03, 2014 0329     Recent Labs Lab 09-03-14 0053  ETH <5    I have personally reviewed the radiological images below and agree with the radiology interpretations.  Ct Head Wo Contrast  09/03/2014   CLINICAL DATA:  Found unresponsive  EXAM: CT HEAD WITHOUT  CONTRAST  TECHNIQUE: Contiguous axial images were obtained from the base of the skull through the vertex without intravenous contrast.  COMPARISON:  None.  FINDINGS: There is a large intraparenchymal left frontal hemorrhage measuring approximately 7 cm in diameter with estimated volume of 170 mL. There is 2 cm left-to-right midline shift. There is intraventricular blood in the lateral ventricles, third ventricle and fourth ventricle. There is effacement of the basal cisterns. There is a small subdural hemorrhage in the anterior temporal regions bilaterally and probably also at the undersurface of the frontal lobes anteriorly.  IMPRESSION: Large intraparenchymal left frontal hemorrhage with 2 cm left-to-right midline shift. There is effacement of the basal cisterns concerning for herniation. There is intraventricular blood. There is a small volume subdural hemorrhage. Critical Value/emergent results were called by telephone at the time of interpretation on 09-03-14 at 1:14 am to Dr. Gwyneth Sprout , who verbally acknowledged these results.   Electronically Signed   By: Ellery Plunk M.D.   On: September 03, 2014 01:18   Dg Chest Portable 1 View  Sep 03, 2014   CLINICAL DATA:  Central line placement.  Initial encounter.  EXAM: PORTABLE CHEST - 1 VIEW  COMPARISON:  Chest radiograph performed 09/22/2012  FINDINGS: The patient's left IJ line is noted ending about the mid to distal SVC. The endotracheal tube is seen ending 3-4 cm above the carina. An enteric tube is noted extending below the diaphragm.  The lungs are well-aerated and clear. There is no evidence of focal opacification, pleural effusion or pneumothorax.  The cardiomediastinal silhouette is borderline normal in size. No acute osseous abnormalities are seen.  IMPRESSION: 1. Left IJ line noted ending about the mid to distal SVC. 2. Endotracheal tube seen ending 3-4 cm above the carina. 3. No acute cardiopulmonary process  seen.   Electronically Signed   By:  Roanna Raider M.D.   On: 09-03-14 02:54    PHYSICAL EXAM  Temp:  [97.2 F (36.2 C)-98.8 F (37.1 C)] 98.8 F (37.1 C) (08/03 0745) Pulse Rate:  [63-156] 146 (08/03 1015) Resp:  [16-31] 20 (08/03 1015) BP: (107-226)/(37-161) 146/67 mmHg (08/03 1015) SpO2:  [97 %-100 %] 100 % (08/03 1015) FiO2 (%):  [30 %-100 %] 30 % (08/03 0800) Weight:  [139 lb 5.3 oz (63.2 kg)-148 lb (67.132 kg)] 139 lb 5.3 oz (63.2 kg) (08/03 0420)  General - Well nourished, well developed, intubated not on sedation.  Ophthalmologic - fundi not visualized due to small pupils.  Cardiovascular - Regular rhythm, but tachycardia.  Neuro - intubated, not on sedation, not open eyes on voice or pain stimulation, pupil 2 mm bilaterally, very sluggish to light. Bilaterally weak corneal reflex, positive gag and cough reflex, breathing over the vent. On pain stimulation, left upper extremity localizing to pain, right upper extremity no movement, bilateral lower extremity withdrawal to pain. Reflexes decreased on the right, bilateral Babinski negative.   ASSESSMENT/PLAN Ms. Lacey Allison is a 74 y.o. female with history of HTN, HLD, smoker, CAD, PVD, stroke in January 2012 admitted for an responsiveness, found to have left large frontal ICH.    Left large frontal ICH with midline shift, likely due to uncontrolled hypertension.  Intubated, not on sedation, tachycardia  CT showed left large frontal ICH, about 60 mL, ICH score 4  SCDs for VTE prophylaxis  Plavix prior to admission, now on no antithrombotic  Discuss with family at bedside regarding poor prognosis with high mortality rates. Family request comfort care and withdrawal care as per her wishes.  Cerebral edema and impending herniation  Significant midline shift  On 3% saline  ICH score 4 with mortality rate 97% within 1 month  Discuss with family and as per patient wishes, family request withdraw care  Hypertension, malignant  Home meds:    Lisinopril, metoprolol Currently on IV cleviprex  Unstable  Will d/c cleviprex due to comfort care  Hyperlipidemia  Home meds:  Lipitor 40   Other Stroke Risk Factors  Advanced age  Cigarette smoker, advised to stop smoking  Hx stroke/TIA  Coronary artery disease  Other Active Problems  Hypokalemia  Significant leukocytosis  Other Pertinent History  Comfort care  Palliative care consult requested  Hospital day # 0  This patient is critically ill due to large left frontal ICA with significant midline shift and high ICH score and at significant risk of neurological worsening, death form recurrent herniation, respiratory failure and cardiac failure. This patient's care requires constant monitoring of vital signs, hemodynamics, respiratory and cardiac monitoring, review of multiple databases, neurological assessment, discussion with family, other specialists and medical decision making of high complexity. I spent 40 minutes of neurocritical care time in the care of this patient.   Marvel Plan, MD PhD Stroke Neurology September 03, 2014 4:41 PM    To contact Stroke Continuity provider, please refer to [email or web address]. After hours, contact General Neurology

## 2014-09-05 NOTE — Progress Notes (Signed)
eLink Physician-Brief Progress Note Patient Name: Lacey Allison DOB: 07/24/1940 MRN: [id number]   Date of Service  08/24/14  HPI/Events of Note  Actively dying, on fentanyl gtt; loud upper airway noises, despite good mouth care, no oral secretions  eICU Interventions  Change to morphine gtt from fentanyl gtt     Intervention Category Major Interventions: End of life / care limitation discussion  Rajveer Handler 08/24/2014, 4:24 PM

## 2014-09-05 NOTE — Progress Notes (Addendum)
Chaplain responded to referral from pt nurse. Family expecting to withdrawal care. Family expressed that this was so quick, but want to respect her wishes. Pt also has more family from [place] of [place] but do not want to wait for them to arrive. Chaplain offered prayer and emotional support. Family pastor present with family now. Chaplain informed pt family of her continued services as needed.    Aug 17, 2014 1000  Clinical Encounter Type  Visited With Family  Visit Type Initial;Spiritual support  Referral From Nurse  Spiritual Encounters  Spiritual Needs Emotional;Grief support  Stress Factors  Family Stress Factors Loss  Breona Cherubin, Mayer Masker, Chaplain 08-17-2014 10:26 AM

## 2014-09-05 NOTE — Progress Notes (Signed)
[facility] ADULT ICU REPLACEMENT PROTOCOL FOR AM LAB REPLACEMENT ONLY  The patient does apply for the [facility] Adult ICU Electrolyte Replacment Protocol based on the criteria listed below:   1. Is GFR >/= 40 ml/min? Yes.    Patient's GFR today is 44 2. Is urine output >/= 0.5 ml/kg/hr for the last 6 hours? Yes.   Patient's UOP is 0.7 ml/kg/hr 3. Is BUN < 60 mg/dL? Yes.    Patient's BUN today is 14 4. Abnormal electrolyte(s):K2.7 5. Ordered repletion with:per protocol 6. If a panic level lab has been reported, has the CCM MD in charge been notified? Yes.  .   Physician:  Thea Gist Aug 18, 2014 5:37 AM

## 2014-09-05 NOTE — ED Provider Notes (Signed)
CSN: [id number]     Arrival date & time 08-17-14  0040 History   This chart was scribed for Lacey Sprout, MD by Lacey Allison, ED Scribe. This patient was seen in room [facility] and the patient's care was started 12:46 AM.   Chief Complaint  Patient presents with  . Code Stroke    LEVEL 5 CAVEAT DUE TO CODE STROKE  The history is provided by the EMS personnel. No language interpreter was used.    HPI Comments: Lacey Allison brought in by EMS is a 74 y.o. female with a PMHx of several TIAs, CAD, CVA, HTN, and hyperlipidemia who presents to the Emergency Department here for code stroke this evening. Per EMS, pt was walking when she suddenly sustained an unwitnessed fall at approximately 9:00 PM. Husband states pt was down for 20-30 minutes prior to EMS was called. Vomit noted on pts shirt at time of arrival. In addition, several episodes of vomiting reported en route to department. Blood pressure 266/110 initially. Lacey Allison was intubated on the field prior to coming to ED without any medications given en route. No history of blood thinners. PSHx includes S/P insertion of iliac artery stent-01/15/11.  Past Medical History  Diagnosis Date  . S/P insertion of iliac artery stent     LEA DOPPLER, 01/15/2011 - ABDOMINAL AORTA-aneurysmal dilation with maximum diameter of 2.8cm  . CAD (coronary artery disease)     LEXISCAN, 12/15/2010 - EKG negatvie for ischemia, post-stress EF 76%, normal scan  . CVA (cerebral vascular accident)     2D ECHO, 01/09/2010 - EF 60-65%, normal  . Hypertension   . Dyslipidemia   . Tobacco abuse    Past Surgical History  Procedure Laterality Date  . Abdominal angiogram  10/18/2006    Ostium of the common iliac artery stented with a 9x25mm Omnilink stent with excellent results  . Cardiac catheterization  04/18/2007    Medical and lifestyle managment   Family History  Problem Relation Age of Onset  . Arrhythmia Mother     Tachycardia, since brith; has Pacemaker   . Heart attack Father   . Diabetes Father   . Obesity Brother   . Diabetes Brother   . Heart attack Maternal Grandfather   . Heart attack Paternal Grandmother   . Cancer Paternal Grandfather   . Cancer Child     Skin  . Hyperlipidemia Child    History  Substance Use Topics  . Smoking status: Current Some Day Smoker -- 50 years  . Smokeless tobacco: Never Used  . Alcohol Use: Yes     Comment: 1-2 drinks every night   OB History    No data available     Review of Systems  Unable to perform ROS: Intubated      Allergies  Bee venom  Home Medications   Prior to Admission medications   Medication Sig Start Date End Date Taking? Authorizing Provider  aspirin 325 MG tablet Take 325 mg by mouth daily.    Historical Provider, MD  atorvastatin (LIPITOR) 40 MG tablet Take 1 tablet (40 mg total) by mouth daily. 07/11/13   Runell Gess, MD  cetirizine (ZYRTEC) 10 MG tablet Take 10 mg by mouth daily.    Historical Provider, MD  clopidogrel (PLAVIX) 75 MG tablet Take 75 mg by mouth daily.    Historical Provider, MD  isosorbide dinitrate (ISORDIL) 30 MG tablet Take 1 tablet (30 mg total) by mouth daily. 09/20/13   Runell Gess, MD  lisinopril (PRINIVIL,ZESTRIL) 20 MG tablet Take 1 tablet (20 mg total) by mouth daily. 09/20/13   Runell Gess, MD  metoprolol (LOPRESSOR) 50 MG tablet Take 50 mg by mouth daily.     Historical Provider, MD  nitroGLYCERIN (NITROSTAT) 0.4 MG SL tablet Place 0.4 mg under the tongue every 5 (five) minutes as needed for chest pain.    Historical Provider, MD   Triage Vitals: BP 202/78 mmHg  Pulse 91  Resp 16  Ht 5\' 5"  (1.651 m)  Wt 148 lb (67.132 kg)  BMI 24.63 kg/m2  SpO2 100%   Physical Exam  Constitutional: She appears well-developed and well-nourished.  Pt is unresponsive and intubated Does not respond to verbal stimuli   HENT:  Head: Normocephalic and atraumatic.  No trauma to head noted  Eyes:  Pupils are minimally reactive L pupil  is 5 mm and R pupil is 3 mm  Cardiovascular: Normal rate, regular rhythm and normal heart sounds.   Pulmonary/Chest: Effort normal and breath sounds normal.  Breath sounds equal  Abdominal: She exhibits no distension. There is no tenderness.  Musculoskeletal:  Postures to pain in bilateral upper extremities  Skin: Skin is warm and dry.  Nursing note and vitals reviewed.   ED Course  Procedures (including critical care time)  DIAGNOSTIC STUDIES: Oxygen Saturation is 100% on RA, Normal by my interpretation.    COORDINATION OF CARE: 12:50 AM- Will order CT head without contrast, i-stat chem 8, ethanol, EKG, PT-INR, APTT, CBC, CMP, urine rapid drug screen, and urinalysis. Discussed treatment plan with pt at bedside and pt agreed to plan.     Labs Review Labs Reviewed  APTT - Abnormal; Notable for the following:    aPTT 23 (*)    All other components within normal limits  CBC - Abnormal; Notable for the following:    WBC 23.4 (*)    Hemoglobin 15.1 (*)    All other components within normal limits  DIFFERENTIAL - Abnormal; Notable for the following:    Neutro Abs 16.4 (*)    Lymphs Abs 5.5 (*)    Monocytes Absolute 1.3 (*)    All other components within normal limits  COMPREHENSIVE METABOLIC PANEL - Abnormal; Notable for the following:    Potassium 3.4 (*)    CO2 20 (*)    Glucose, Bld 201 (*)    Creatinine, Ser 1.10 (*)    GFR calc non Af Amer 49 (*)    GFR calc Af Amer 56 (*)    All other components within normal limits  BLOOD GAS, ARTERIAL - Abnormal; Notable for the following:    pO2, Arterial 439 (*)    Acid-base deficit 3.3 (*)    All other components within normal limits  I-STAT CHEM 8, ED - Abnormal; Notable for the following:    Potassium 3.2 (*)    Glucose, Bld 206 (*)    Hemoglobin 16.3 (*)    HCT 48.0 (*)    All other components within normal limits  CBG MONITORING, ED - Abnormal; Notable for the following:    Glucose-Capillary 214 (*)    All other  components within normal limits  ETHANOL  PROTIME-INR  URINE RAPID DRUG SCREEN, HOSP PERFORMED  URINALYSIS, ROUTINE W REFLEX MICROSCOPIC (NOT AT [facility])  I-STAT TROPOININ, ED    Imaging Review Ct Head Wo Contrast  08/26/14   CLINICAL DATA:  Found unresponsive  EXAM: CT HEAD WITHOUT CONTRAST  TECHNIQUE: Contiguous axial images were obtained from the base  of the skull through the vertex without intravenous contrast.  COMPARISON:  None.  FINDINGS: There is a large intraparenchymal left frontal hemorrhage measuring approximately 7 cm in diameter with estimated volume of 170 mL. There is 2 cm left-to-right midline shift. There is intraventricular blood in the lateral ventricles, third ventricle and fourth ventricle. There is effacement of the basal cisterns. There is a small subdural hemorrhage in the anterior temporal regions bilaterally and probably also at the undersurface of the frontal lobes anteriorly.  IMPRESSION: Large intraparenchymal left frontal hemorrhage with 2 cm left-to-right midline shift. There is effacement of the basal cisterns concerning for herniation. There is intraventricular blood. There is a small volume subdural hemorrhage. Critical Value/emergent results were called by telephone at the time of interpretation on August 19, 2014 at 1:14 am to Dr. Gwyneth Allison , who verbally acknowledged these results.   Electronically Signed   By: Ellery Plunk M.D.   On: 2014-08-19 01:18     EKG Interpretation   Date/Time:  Wednesday 08/19/2014 00:50:34 EDT Ventricular Rate:  143 PR Interval:  118 QRS Duration: 94 QT Interval:  322 QTC Calculation: 497 R Axis:   80 Text Interpretation:  Sinus tachycardia Ventricular premature complex  Aberrant conduction of SV complex(es) Borderline prolonged QT interval  Baseline wander in lead(s) V1 T wave inversion RESOLVED SINCE PREVIOUS  Confirmed by Anitra Lauth  MD, Alphonzo Lemmings ([id number]) on 2014-08-19 1:00:14 AM      MDM   Final diagnoses:   Intracranial hemorrhage  Brain herniation    Patient presenting be a EMS after a witnessed collapse to the floor without regaining of consciousness. Patient does take Plavix. When EMS arrived patient was posturing has been unresponsive. Due to vomiting she was intubated in route for airway protection. Upon arrival here patient is intubated however she received no medications for intubation. She has posturing with stimulation of bilateral upper arms. She has unequal pupils and concern for herniation. Code stroke was called upon patient's arrival as symptoms started approximately 1 hour prior to arrival. Head CT showed a large intraparenchymal left frontal hemorrhage with midline shift. Dr. Thad Ranger was present and at this time there is no intervention that would be helpful. She will admit the patient. Has been notified.  I personally performed the services described in this documentation, which was scribed in my presence.  The recorded information has been reviewed and considered.    Lacey Sprout, MD August 19, 2014 [phone]

## 2014-09-05 NOTE — Procedures (Signed)
Central Venous Catheter Insertion Procedure Note Lacey Allison [id number] 1940-07-16  Procedure: Insertion of Central Venous Catheter Indications: Assessment of intravascular volume, Drug and/or fluid administration and Frequent blood sampling  Procedure Details Consent: Unable to obtain consent because of emergent medical necessity. Time Out: Verified patient identification, verified procedure, site/side was marked, verified correct patient position, special equipment/implants available, medications/allergies/relevent history reviewed, required imaging and test results available.  Performed  Maximum sterile technique was used including antiseptics, cap, gloves, gown, hand hygiene, mask and sheet. Skin prep: Chlorhexidine; local anesthetic administered A antimicrobial bonded/coated triple lumen catheter was placed in the left internal jugular vein using the Seldinger technique. Ultrasound guidance used.Yes.   Catheter placed to 20 cm. Blood aspirated via all 3 ports and then flushed x 3. Line sutured x 2 and dressing applied.  Evaluation Blood flow good Complications: No apparent complications Patient did tolerate procedure well. Chest X-ray ordered to verify placement.  CXR: pending.  Joneen Roach, AGACNP-BC [facility] Pulmonology/Critical Care Pager [phone] or [phone]  08-20-14 3:07 AM

## 2014-09-05 NOTE — Progress Notes (Signed)
Code stroke called on 74 y.o female. EMS called to scene after husband found Patient on floor after hearing a fall. LSN 2345 per husband. PMX includes TIA, CAD, CVA (2012), HTN, hyperlipidemia, tobacco abuse. CBG 214. Pt intubated in field per EMS for airway protection after episodes of vomiting, no sedation or medications given. After arrival to ED bay Pt assessed per EDP and Neurologist in triage room and then taken to CT scan STAT. CT scan yielding large frontal intracerebral hemorrhage. Per Neurologist prognosis poor and after speaking with Dr. Gerlene Fee, no surgical intervention indicated. BP elevated, Cleviprex gtt started. Pt with decerebrate posturing and unequal unreactive pupils. NIHSS 33. Husband at bedside, update on Pt status and poor prognosis per Dr. Thad Ranger. Pt made DNR, husband to contact family. Pt for admit to [facility] ICU.

## 2014-09-05 NOTE — Procedures (Signed)
Extubation Procedure Note  Patient Details:   Name: Lacey Allison DOB: 1940-02-21 MRN: [id number]   Airway Documentation:  Airway 7 mm (Active)  Secured at (cm) 23 cm 2014/09/03  7:53 AM  Measured From Lips 09-03-2014  7:53 AM  Secured Location Right 2014-09-03  7:53 AM  Secured By [employer] 09-03-14  7:53 AM  Tube Holder Repositioned Yes 2014/09/03  7:53 AM  Cuff Pressure (cm H2O) 25 cm H2O 09/03/14  7:53 AM  Site Condition Dry 2014/09/03  7:53 AM    Evaluation  O2 sats: currently acceptable Complications: No apparent complications Patient did tolerate procedure well. Bilateral Breath Sounds: Clear Suctioning: Oral, Airway No  Pt extubated using withdrawal of life guidelines.   Ok Anis, MA 03-Sep-2014, 11:14 AM

## 2014-09-05 NOTE — Progress Notes (Signed)
240cc morphine gtt wasted in sink and 160cc fentanyl gtt wasted in sink- witnessed by Londell Moh, RN.

## 2014-09-05 NOTE — Progress Notes (Signed)
Patient seen and evaluated.  All the members of the family have been explained about the diagnosis, current plan and prognosis. They are in unanimous decision (as per patient wishes) that they want withdraw of care or comfort care given the nature of the condition is serious and terminal. Pt has expressed in the living will that if she suffers a devastating injury with no chance of meaningful recovery and is in a terminal condition then wanted end of care. As per patient and family wishes we will initiate hospice care, withdraw of care and stop any life prolonging therapies. I agree with DNR, withdraw of care and comfort care.  Marvel Plan, MD PhD Stroke Neurology 08-29-14 4:40 PM

## 2014-09-05 DEATH — deceased

## 2016-12-13 IMAGING — CT CT HEAD W/O CM
2 series · 16 of 30 positions shown, 18 images · non-contrast
Comparison: None.

CLINICAL DATA: Found unresponsive

EXAM:
CT HEAD WITHOUT CONTRAST
TECHNIQUE: Contiguous axial images were obtained from the base of the skull
through the vertex without intravenous contrast.

[Series 201: head w/o, idose (1) · axial · non-contrast · 0.49mm/px · z∈[+54,+184]mm · 8 of 34 slices shown, 10 images]
[im 4/34  brain]
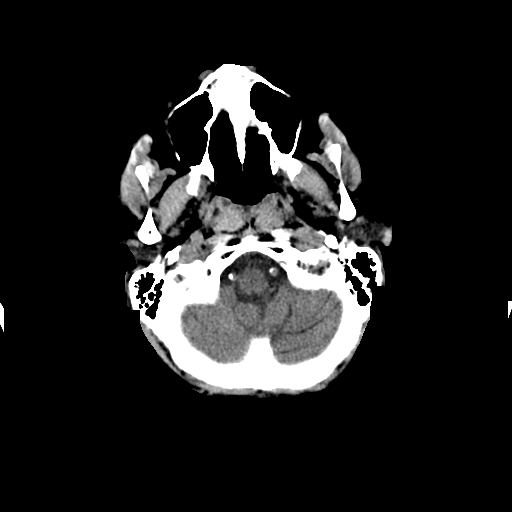
[im 4/34  bone]
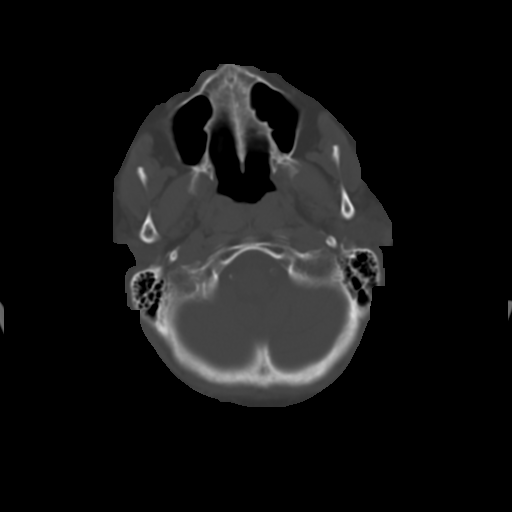
[im 8/34  brain]
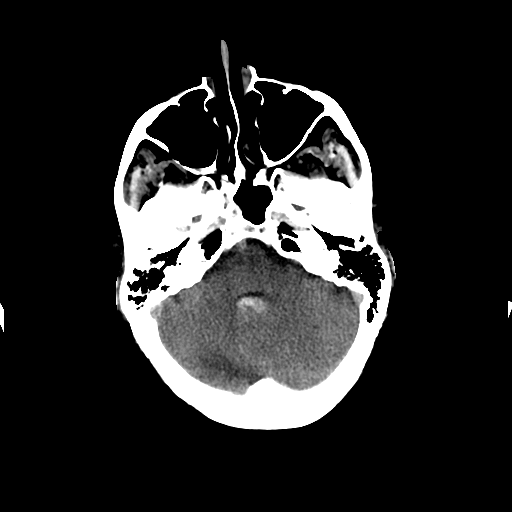
[im 12/34  brain]
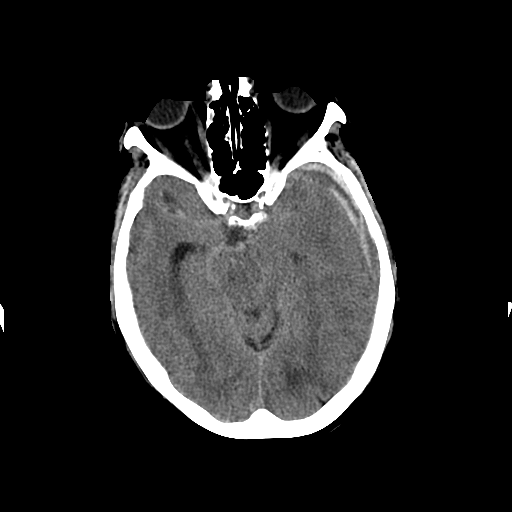
[im 15/34  brain]
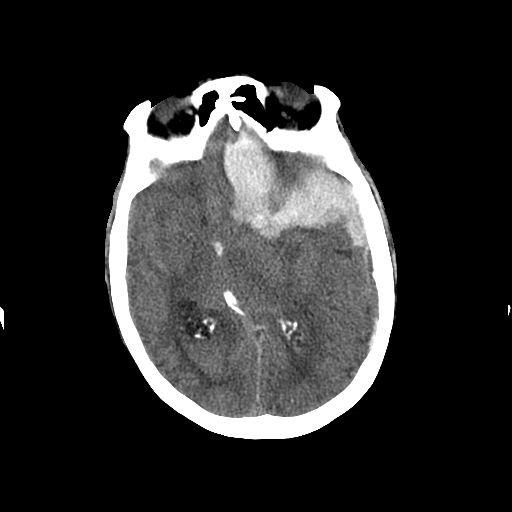
[im 19/34  brain]
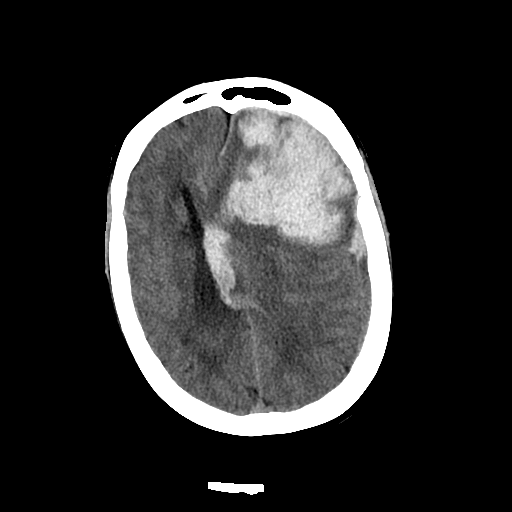
[im 19/34  bone]
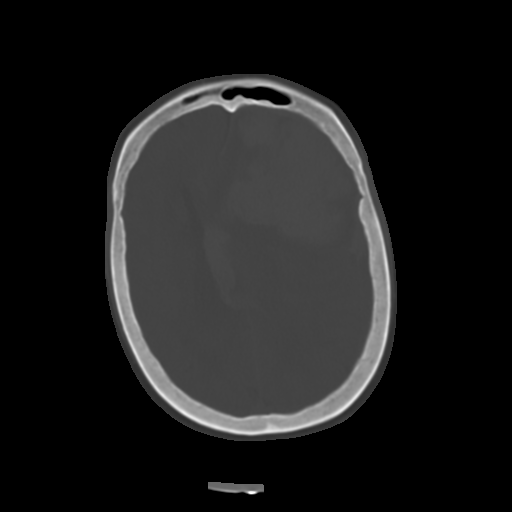
[im 23/34  brain]
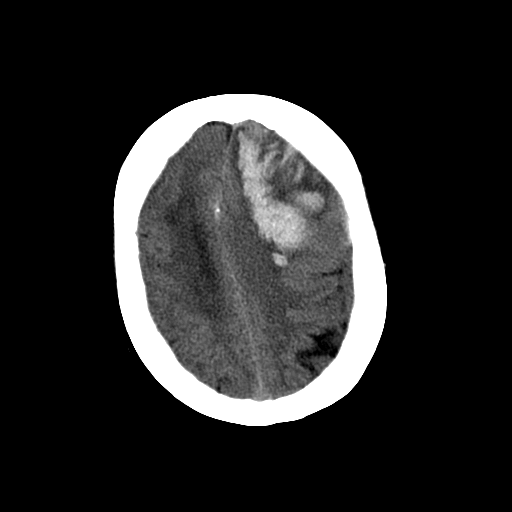
[im 26/34  brain]
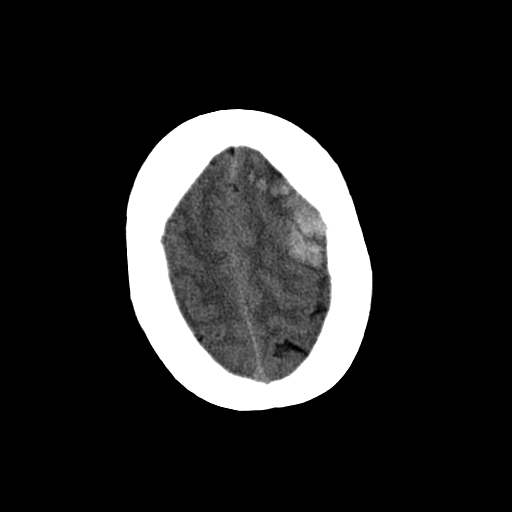
[im 30/34  brain]
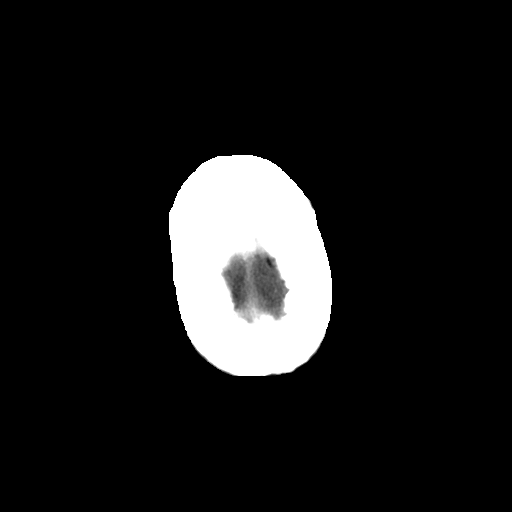

[Series 202: head w/o bone, idose (1) · axial · non-contrast · 0.49mm/px · z∈[+55,+185]mm · 8 of 68 slices shown]
[im 8/68  bone]
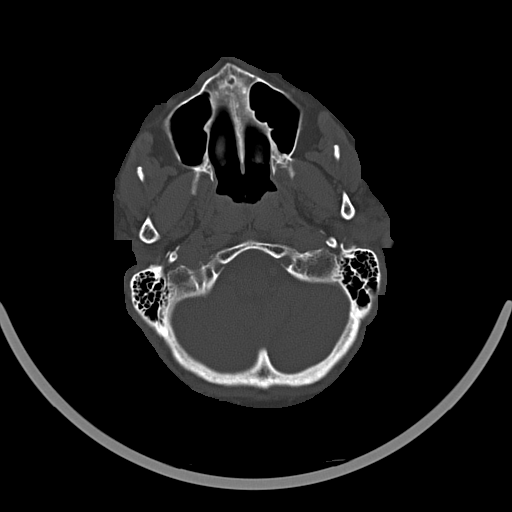
[im 15/68  bone]
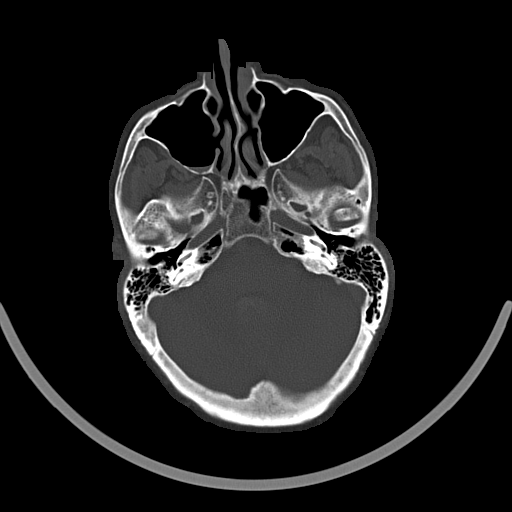
[im 22/68  bone]
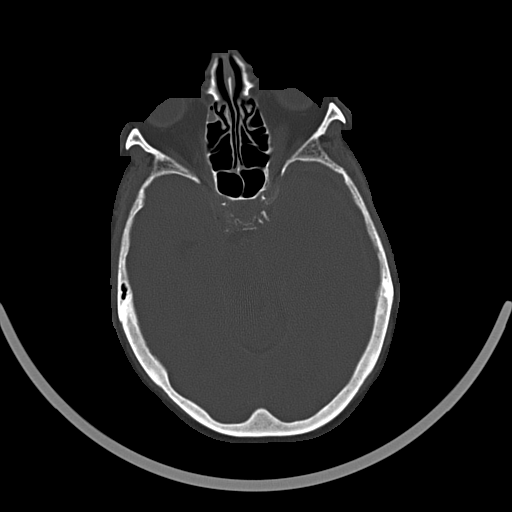
[im 29/68  bone]
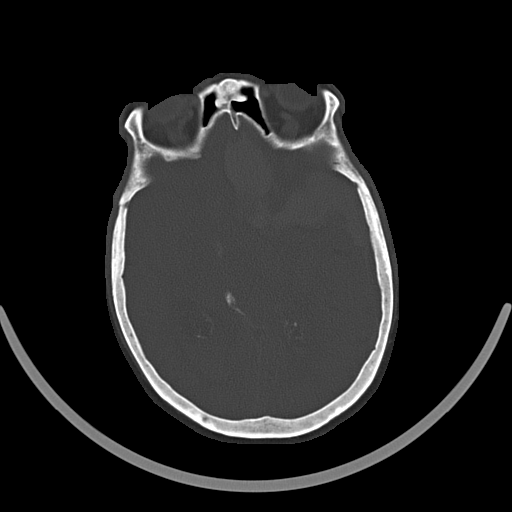
[im 39/68  bone]
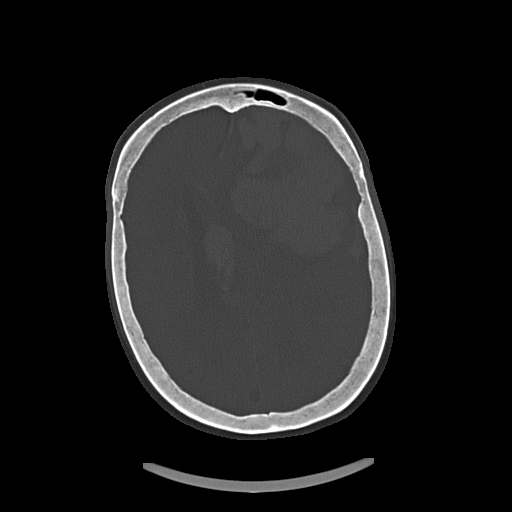
[im 46/68  bone]
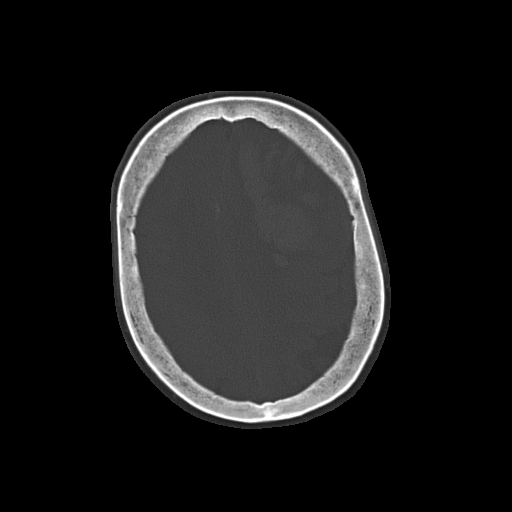
[im 53/68  bone]
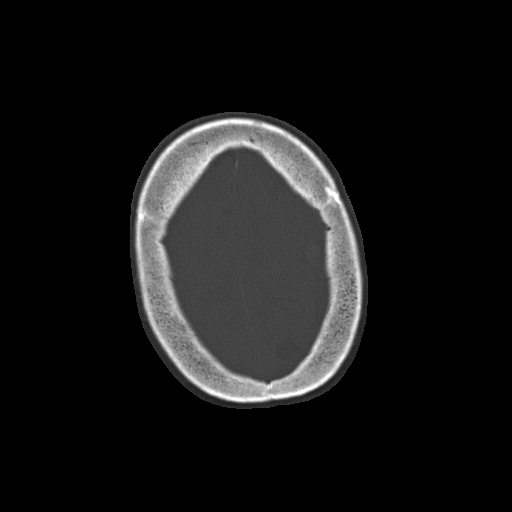
[im 60/68  bone]
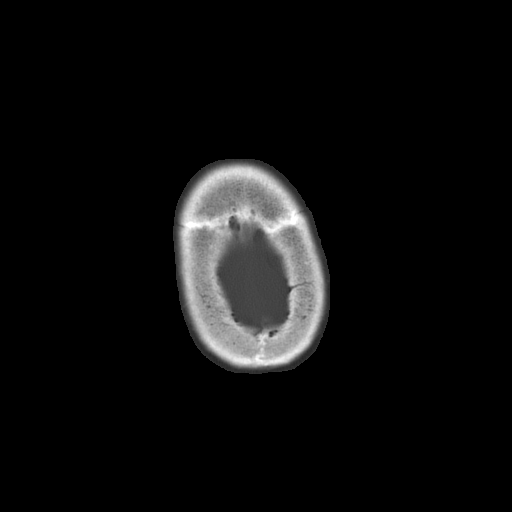

[16 of 30 positions shown; findings below may reference images not displayed]

FINDINGS: There is a large intraparenchymal left frontal hemorrhage measuring
approximately 7 cm in diameter with estimated volume of 170 mL.
There is 2 cm left-to-right midline shift. There is intraventricular
blood in the lateral ventricles, third ventricle and fourth
ventricle. There is effacement of the basal cisterns. There is a
small subdural hemorrhage in the anterior temporal regions
bilaterally and probably also at the undersurface of the frontal
lobes anteriorly.
IMPRESSION: Large intraparenchymal left frontal hemorrhage with 2 cm
left-to-right midline shift. There is effacement of the basal
cisterns concerning for herniation. There is intraventricular blood.
There is a small volume subdural hemorrhage. Critical Value/emergent
results were called by telephone at the time of interpretation on
08/07/2014 at [DATE] to Dr. BLADE AUJLA , who verbally
acknowledged these results.

## 2016-12-13 IMAGING — CR DG CHEST 1V PORT
1 series · 1 of 1 positions shown · non-contrast
Comparison: Chest radiograph performed 09/22/2012

CLINICAL DATA: Central line placement.  Initial encounter.

EXAM:
PORTABLE CHEST - 1 VIEW

[AP]
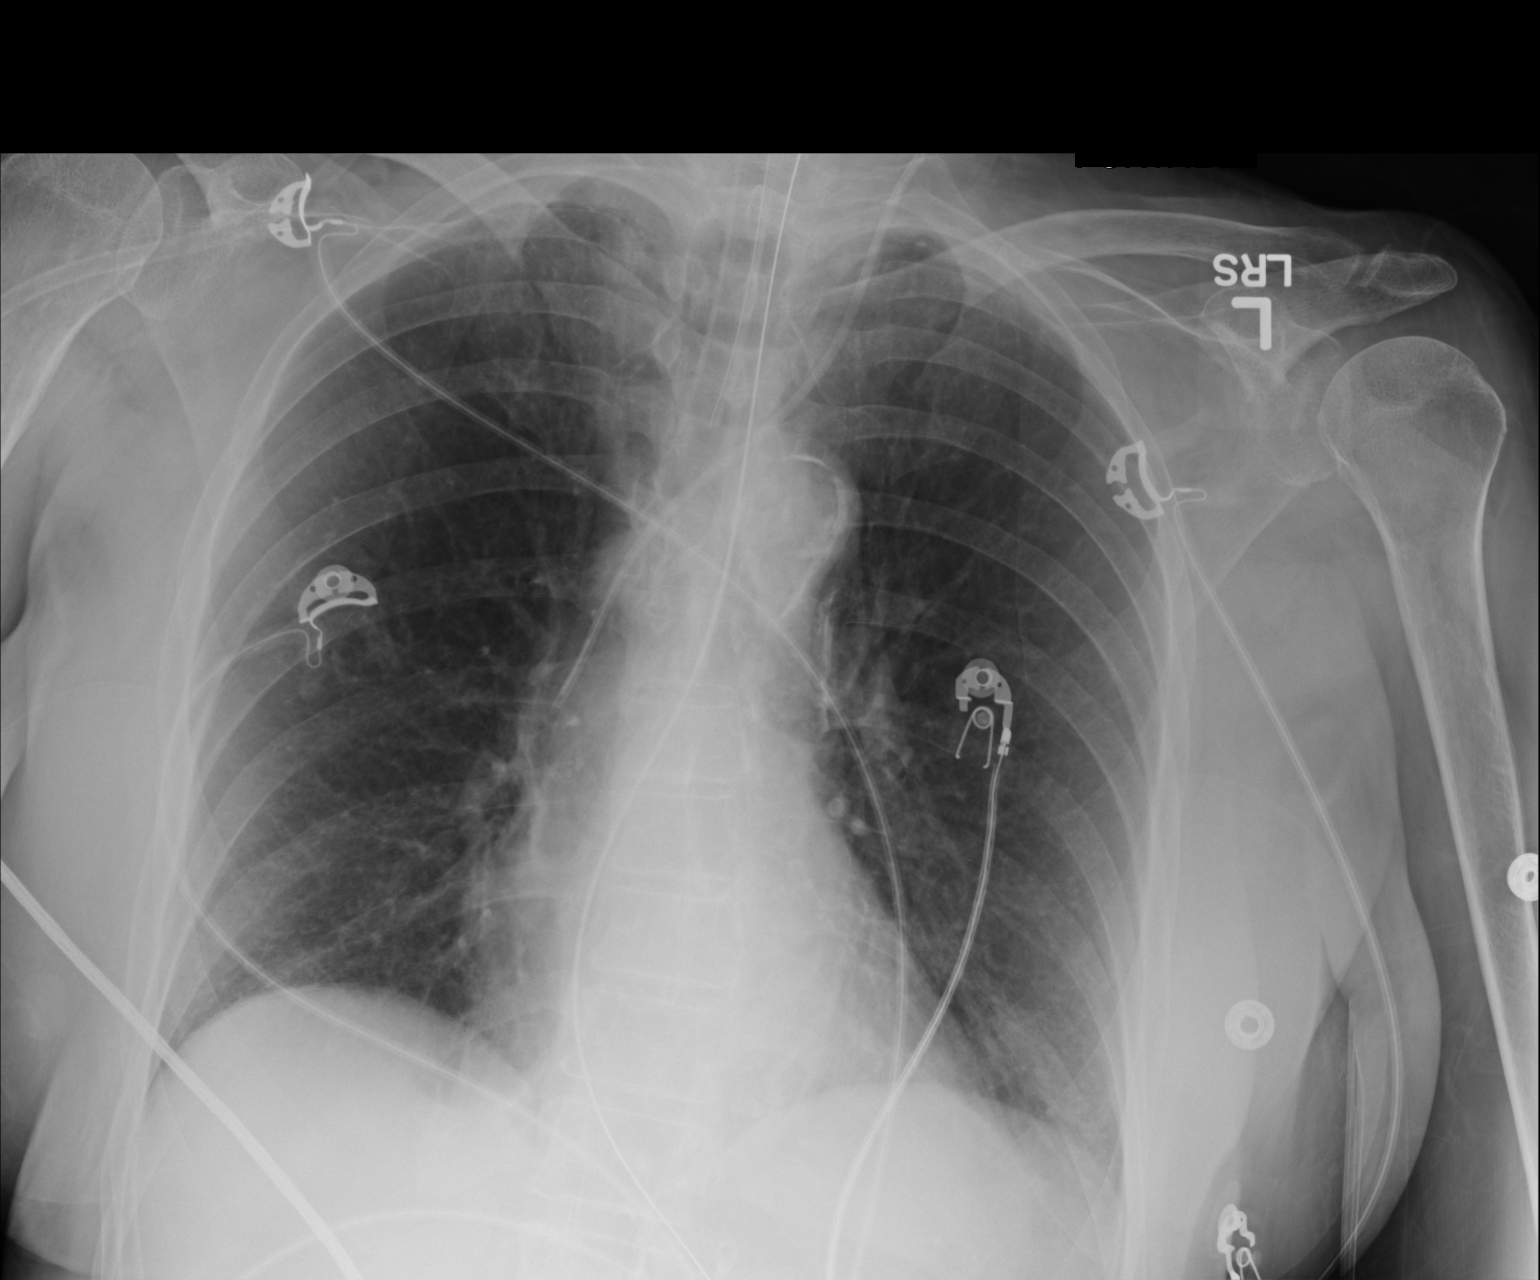

[1 of 1 positions shown; findings below may reference images not displayed]

FINDINGS: The patient's left IJ line is noted ending about the mid to distal
SVC. The endotracheal tube is seen ending 3-4 cm above the carina.
An enteric tube is noted extending below the diaphragm.

The lungs are well-aerated and clear. There is no evidence of focal
opacification, pleural effusion or pneumothorax.

The cardiomediastinal silhouette is borderline normal in size. No
acute osseous abnormalities are seen.
IMPRESSION: 1. Left IJ line noted ending about the mid to distal SVC.
2. Endotracheal tube seen ending 3-4 cm above the carina.
3. No acute cardiopulmonary process seen.
